# Patient Record
Sex: Female | Born: 1975 | Race: White | Hispanic: No | Marital: Married | Smoking: Current every day smoker
Health system: Southern US, Community
[De-identification: ages and names within clinical notes are randomized; demographics above are authoritative.]

## PROBLEM LIST (undated history)

## (undated) DIAGNOSIS — R51 Headache: Secondary | ICD-10-CM

## (undated) DIAGNOSIS — D329 Benign neoplasm of meninges, unspecified: Secondary | ICD-10-CM

## (undated) DIAGNOSIS — J449 Chronic obstructive pulmonary disease, unspecified: Secondary | ICD-10-CM

## (undated) DIAGNOSIS — M359 Systemic involvement of connective tissue, unspecified: Secondary | ICD-10-CM

## (undated) DIAGNOSIS — I10 Essential (primary) hypertension: Secondary | ICD-10-CM

## (undated) DIAGNOSIS — M329 Systemic lupus erythematosus, unspecified: Secondary | ICD-10-CM

## (undated) DIAGNOSIS — J45909 Unspecified asthma, uncomplicated: Secondary | ICD-10-CM

## (undated) DIAGNOSIS — IMO0002 Reserved for concepts with insufficient information to code with codable children: Secondary | ICD-10-CM

## (undated) DIAGNOSIS — I219 Acute myocardial infarction, unspecified: Secondary | ICD-10-CM

## (undated) DIAGNOSIS — I2510 Atherosclerotic heart disease of native coronary artery without angina pectoris: Secondary | ICD-10-CM

## (undated) DIAGNOSIS — F32A Depression, unspecified: Secondary | ICD-10-CM

## (undated) DIAGNOSIS — C801 Malignant (primary) neoplasm, unspecified: Secondary | ICD-10-CM

## (undated) DIAGNOSIS — R251 Tremor, unspecified: Secondary | ICD-10-CM

## (undated) DIAGNOSIS — M1990 Unspecified osteoarthritis, unspecified site: Secondary | ICD-10-CM

## (undated) DIAGNOSIS — R519 Headache, unspecified: Secondary | ICD-10-CM

## (undated) DIAGNOSIS — K589 Irritable bowel syndrome without diarrhea: Secondary | ICD-10-CM

## (undated) DIAGNOSIS — K219 Gastro-esophageal reflux disease without esophagitis: Secondary | ICD-10-CM

## (undated) DIAGNOSIS — N809 Endometriosis, unspecified: Secondary | ICD-10-CM

## (undated) DIAGNOSIS — K259 Gastric ulcer, unspecified as acute or chronic, without hemorrhage or perforation: Secondary | ICD-10-CM

## (undated) DIAGNOSIS — E119 Type 2 diabetes mellitus without complications: Secondary | ICD-10-CM

## (undated) DIAGNOSIS — E785 Hyperlipidemia, unspecified: Secondary | ICD-10-CM

## (undated) DIAGNOSIS — F419 Anxiety disorder, unspecified: Secondary | ICD-10-CM

## (undated) DIAGNOSIS — G4730 Sleep apnea, unspecified: Secondary | ICD-10-CM

## (undated) DIAGNOSIS — C4491 Basal cell carcinoma of skin, unspecified: Secondary | ICD-10-CM

## (undated) DIAGNOSIS — J189 Pneumonia, unspecified organism: Secondary | ICD-10-CM

## (undated) DIAGNOSIS — M797 Fibromyalgia: Secondary | ICD-10-CM

## (undated) HISTORY — DX: Basal cell carcinoma of skin, unspecified: C44.91

## (undated) HISTORY — PX: ABDOMINAL HYSTERECTOMY: SHX81

## (undated) HISTORY — PX: OTHER SURGICAL HISTORY: SHX169

## (undated) HISTORY — DX: Benign neoplasm of meninges, unspecified: D32.9

## (undated) HISTORY — DX: Type 2 diabetes mellitus without complications: E11.9

## (undated) HISTORY — PX: DILATION AND CURETTAGE OF UTERUS: SHX78

---

## 1996-09-13 HISTORY — PX: MULTIPLE TOOTH EXTRACTIONS: SHX2053

## 1997-05-25 DIAGNOSIS — M797 Fibromyalgia: Secondary | ICD-10-CM | POA: Insufficient documentation

## 2000-04-13 HISTORY — PX: DIAGNOSTIC LAPAROSCOPY: SUR761

## 2006-09-13 HISTORY — PX: CHOLECYSTECTOMY: SHX55

## 2008-09-13 HISTORY — PX: CARDIAC CATHETERIZATION: SHX172

## 2009-08-13 DIAGNOSIS — I219 Acute myocardial infarction, unspecified: Secondary | ICD-10-CM

## 2009-08-13 HISTORY — DX: Acute myocardial infarction, unspecified: I21.9

## 2011-09-14 HISTORY — PX: LAPAROSCOPIC OVARIAN CYSTECTOMY: SUR786

## 2017-03-13 DIAGNOSIS — J449 Chronic obstructive pulmonary disease, unspecified: Secondary | ICD-10-CM

## 2017-03-13 HISTORY — DX: Chronic obstructive pulmonary disease, unspecified: J44.9

## 2017-05-25 DIAGNOSIS — J449 Chronic obstructive pulmonary disease, unspecified: Secondary | ICD-10-CM | POA: Insufficient documentation

## 2017-06-16 DIAGNOSIS — M329 Systemic lupus erythematosus, unspecified: Secondary | ICD-10-CM | POA: Insufficient documentation

## 2017-06-16 DIAGNOSIS — IMO0002 Reserved for concepts with insufficient information to code with codable children: Secondary | ICD-10-CM | POA: Insufficient documentation

## 2017-06-27 DIAGNOSIS — Z6841 Body Mass Index (BMI) 40.0 and over, adult: Secondary | ICD-10-CM | POA: Insufficient documentation

## 2017-08-08 NOTE — H&P (Signed)
[name] is a 41 y.o. female  G2P1here for TAH / BSO and ureteral stenting  .Ptwith  CPP and worsening endometriosis . Pt has a loing h/o of pelvic pain and has 2 surgeries For CPP and was diagnosed with endometrioma/ endometrioisis(  ) . She has pain for upto 3 weeks / month usually worse before her cycle . She has tried Depo Provera , Depo Lupron   She is s/p MI 2010 and stent placement  and she has had a recent EST and echo  Reportedly WNL .    U/s today shows a 2 cm complex ovarian cyst , o/w test is nl .  Op report from [facility] 2013 showed  adhesions , right ovarian cyst and endometriosis. She underwent enterolysis , right ovarian cystectomy and fulguration of endometriosis .    She has a h/o Lupus and RA among other medical problems    S/p one cesarean  section   Past Medical History:  has a past medical history of Arthritis, COPD (chronic obstructive pulmonary disease) , unspecified (CMS-HCC), Endometriosis, Essential tremor, Fibromyalgia, GERD (gastroesophageal reflux disease), arthroscopy of knee (2016, 2017), Hyperlipidemia, unspecified, Hypertension, IBS (irritable bowel syndrome), Lupus, Miscarriage (04/1997), Myocardial infarction (CMS-HCC), Rheumatoid arthritis (CMS-HCC), Sleep apnea, and Stomach ulcer.  Past Surgical History:  has a past surgical history that includes Cesarean section; Cholecystectomy (2008); Dilation and curettage of uterus (1998); Laparoscopic ovarian cystectomy (09/2011); and Pelvic laparoscopy (04/2000 and 09/2011). Family History: family history includes Coronary Artery Disease (Blocked arteries around heart) in her father and mother; Fibroids in her sister; High blood pressure (Hypertension) in her sister; Irritable bowel syndrome in her mother. Social History:  reports that she has been smoking cigarettes.  She has been smoking about 1.00 pack per day. She has never used smokeless tobacco. She reports that she does not drink alcohol or use  drugs. OB/GYN History:          OB History    Gravida  2   Para  1   Term      Preterm      AB  1   Living  1     SAB      TAB      Ectopic      Molar      Multiple      Live Births  1          Allergies: is allergic to avelox [moxifloxacin]; cycline-250 [tetracycline]; macrobid [nitrofurantoin monohyd/m-cryst]; penicillin; and sulfa (sulfonamide antibiotics). Medications:  Current Outpatient Medications:  .  albuterol 90 mcg/actuation inhaler, Inhale 2 inhalations into the lungs every 6 (six) hours as needed for Wheezing., Disp: , Rfl:  .  aspirin 81 MG EC tablet, Take 81 mg by mouth once daily., Disp: , Rfl:  .  atorvastatin (LIPITOR) 80 MG tablet, Take 80 mg by mouth once daily., Disp: , Rfl:  .  azelastine (ASTELIN) 137 mcg nasal spray, Place 1 spray into both nostrils 2 (two) times daily as needed for Rhinitis., Disp: , Rfl:  .  baclofen (LIORESAL) 10 MG tablet, Take 1 tablet (10 mg total) by mouth 2 (two) times daily, Disp: 60 tablet, Rfl: 11 .  chlorthalidone 25 MG tablet, Take 1 tablet (25 mg total) by mouth once daily., Disp: 30 tablet, Rfl: 2 .  DULoxetine (CYMBALTA) 60 MG DR capsule, Take 1 capsule (60 mg total) by mouth 2 (two) times daily, Disp: 180 capsule, Rfl: 0 .  fluticasone (FLONASE) 50 mcg/actuation nasal spray,  Place 2 sprays into both nostrils once daily as needed for Rhinitis., Disp: , Rfl:  .  fluticasone-vilanterol (BREO ELLIPTA) 100-25 mcg/dose DsDv inhaler, Inhale 1 inhalation into the lungs once daily., Disp: , Rfl:  .  hydroxychloroquine (PLAQUENIL) 200 mg tablet, 1 TAB TWICE A DAY , 90 DAYS, Disp: 180 tablet, Rfl: 1 .  levocetirizine (XYZAL) 5 MG tablet, Take 1 tablet (5 mg total) by mouth every evening, Disp: 30 tablet, Rfl: 11 .  lisinopril (PRINIVIL,ZESTRIL) 20 MG tablet, Take 1 tablet (20 mg total) by mouth once daily, Disp: 30 tablet, Rfl: 5 .  metoprolol tartrate (LOPRESSOR) 50 MG tablet, Take 1 tablet (50 mg total) by mouth  2 (two) times daily, Disp: 60 tablet, Rfl: 5 .  oxyCODONE-acetaminophen (PERCOCET) 10-325 mg tablet, Take 1 tablet by mouth every 6 (six) hours as needed for Pain, Disp: 150 tablet, Rfl: 0 .  pantoprazole (PROTONIX) 40 MG DR tablet, Take 1 tablet (40 mg total) by mouth once daily, Disp: 30 tablet, Rfl: 6 .  primidone (MYSOLINE) 50 MG tablet, TAKE 1 TABLET BY MOUTH TWICE A DAY, Disp: 60 tablet, Rfl: 1 .  SUMAtriptan (IMITREX) 25 MG tablet, Take 25 mg by mouth once as needed for Migraine. May take a second dose after 2 hours if needed., Disp: , Rfl:  .  tiZANidine (ZANAFLEX) 4 MG tablet, Take 4 mg by mouth nightly., Disp: , Rfl:  .  traZODone (DESYREL) 100 MG tablet, Take by mouth nightly, Disp: , Rfl:  .  ibuprofen (ADVIL,MOTRIN) 800 MG tablet, Take 800 mg by mouth 2 (two) times daily as needed for Pain., Disp: , Rfl:   Review of Systems: General:                      No fatigue or weight loss Eyes:                           No vision changes Ears:                            No hearing difficulty Respiratory:                No cough or shortness of breath Pulmonary:                  No asthma or shortness of breath Cardiovascular:           No chest pain, palpitations, dyspnea on exertion Gastrointestinal:          No abdominal bloating, chronic diarrhea, constipations, masses, pain or hematochezia Genitourinary:             No hematuria, dysuria, abnormal vaginal discharge, ++pelvic pain, Menometrorrhagia Lymphatic:                   No swollen lymph nodes Musculoskeletal:         No muscle weakness Neurologic:                  No extremity weakness, syncope, seizure disorder Psychiatric:                  No history of depression, delusions or suicidal/homicidal ideation    Exam:      Vitals:   08/02/17 1456  BP: 126/78  Pulse: 82    Body mass index is 51.32 kg/m.  WDWN white/  female in  NAD   Lungs: CTA  CV : RRR without murmur   Neck:  no thyromegaly Abdomen: soft ,  no mass, normal active bowel sounds,  non-tender, no rebound tenderness Pelvic: tanner stage 5 ,  External genitalia: vulva /labia no lesions Urethra: no prolapse Vagina: normal physiologic d/c Cervix: no lesions, no cervical motion tenderness   Uterus: normal size shape and contour, non-tender Adnexa: no mass,  non-tender   Rectovaginal:   Impression:   The primary encounter diagnosis was Pelvic pain in female. Diagnoses of Endometriosis and Female dyspareunia were also pertinent to this visit.  Pain is chronic and conservative treatments with 2 surgeries and medications have not controlled her pain   Plan:   I have spoken with the patient regarding treatment options including expectant management, hormonal options, or surgical intervention. After a full discussion the pt elects to proceed with TAH/ BSO and ureteral stenting to aid with dissection . She is aware of her risk of early menopause  And esrly osteoporosis. She will not be a candidate for estrogen containing meds post op .   25 minutes with direct pt counseling     Maria Her Brentton Wardlow MD

## 2017-08-09 ENCOUNTER — Inpatient Hospital Stay: Admission: RE | Admit: 2017-08-09 | Payer: Medicare Other | Source: Ambulatory Visit

## 2017-08-11 ENCOUNTER — Encounter
Admission: RE | Admit: 2017-08-11 | Discharge: 2017-08-11 | Disposition: A | Discharge status: Home / Self Care | Payer: Medicare Other | Source: Ambulatory Visit | Attending: Obstetrics and Gynecology | Admitting: Obstetrics and Gynecology

## 2017-08-11 ENCOUNTER — Other Ambulatory Visit: Payer: Self-pay

## 2017-08-11 DIAGNOSIS — Z01818 Encounter for other preprocedural examination: Secondary | ICD-10-CM | POA: Insufficient documentation

## 2017-08-11 HISTORY — DX: Anxiety disorder, unspecified: F41.9

## 2017-08-11 HISTORY — DX: Acute myocardial infarction, unspecified: I21.9

## 2017-08-11 HISTORY — DX: Systemic lupus erythematosus, unspecified: M32.9

## 2017-08-11 HISTORY — DX: Endometriosis, unspecified: N80.9

## 2017-08-11 HISTORY — DX: Chronic obstructive pulmonary disease, unspecified: J44.9

## 2017-08-11 HISTORY — DX: Tremor, unspecified: R25.1

## 2017-08-11 HISTORY — DX: Fibromyalgia: M79.7

## 2017-08-11 HISTORY — DX: Sleep apnea, unspecified: G47.30

## 2017-08-11 HISTORY — DX: Headache: R51

## 2017-08-11 HISTORY — DX: Atherosclerotic heart disease of native coronary artery without angina pectoris: I25.10

## 2017-08-11 HISTORY — DX: Reserved for concepts with insufficient information to code with codable children: IMO0002

## 2017-08-11 HISTORY — DX: Essential (primary) hypertension: I10

## 2017-08-11 HISTORY — DX: Headache, unspecified: R51.9

## 2017-08-11 HISTORY — DX: Unspecified osteoarthritis, unspecified site: M19.90

## 2017-08-11 LAB — TYPE AND SCREEN
ABO/RH(D): O POS
Antibody Screen: NEGATIVE

## 2017-08-11 LAB — CBC
HCT: 40.5 % (ref 35.0–47.0)
Hemoglobin: 13.7 g/dL (ref 12.0–16.0)
MCH: 31.2 pg (ref 26.0–34.0)
MCHC: 33.9 g/dL (ref 32.0–36.0)
MCV: 92.1 fL (ref 80.0–100.0)
Platelets: 271 10*3/uL (ref 150–440)
RBC: 4.4 MIL/uL (ref 3.80–5.20)
RDW: 12.7 % (ref 11.5–14.5)
WBC: 12.1 10*3/uL — ABNORMAL HIGH (ref 3.6–11.0)

## 2017-08-11 LAB — BASIC METABOLIC PANEL
Anion gap: 8 (ref 5–15)
BUN: 10 mg/dL (ref 6–20)
CO2: 27 mmol/L (ref 22–32)
Calcium: 9 mg/dL (ref 8.9–10.3)
Chloride: 100 mmol/L — ABNORMAL LOW (ref 101–111)
Creatinine, Ser: 0.68 mg/dL (ref 0.44–1.00)
GFR calc Af Amer: >60 mL/min (ref >60)
GFR calc non Af Amer: >60 mL/min (ref >60)
Glucose, Bld: 141 mg/dL — ABNORMAL HIGH (ref 65–99)
Potassium: 3.6 mmol/L (ref 3.5–5.1)
Sodium: 135 mmol/L (ref 135–145)

## 2017-08-11 NOTE — Pre-Procedure Instructions (Signed)
Incentive Spirometer given to pt with verbal and written instructions, pt returned a correct demo.

## 2017-08-11 NOTE — Patient Instructions (Signed)
  Your procedure is scheduled on: Monday Dec.3, 2018. Report to Same Day Surgery. To find out your arrival time please call [phone] between 1PM - 3PM on .  Remember: Instructions that are not followed completely may result in serious medical risk, up to and including death, or upon the discretion of your surgeon and anesthesiologist your surgery may need to be rescheduled.    _x___ 1. Do not eat food after midnight night prior to surgery. No gum   chewing or hard candies, snacks or breakfast.    2023-02-23 drink the following: water, Gatorade, clear apple juice, black coffee     or black tea up until 2 hours prior to ARRIVAL time.     ____ 2. No Alcohol for 24 hours before or after surgery.   ____ 3. Bring all medications with you on the day of surgery if instructed.    __x__ 4. Notify your doctor if there is any change in your medical condition     (cold, fever, infections).    __x___ 5.   Do Not Smoke or use e-cigarettes For 24 Hours Prior to Your   Surgery.  Do not use any chewable tobacco products for at least 6   hours prior to  surgery.                      Do not wear jewelry, make-up, hairpins, clips or nail polish.  Do not wear lotions, powders, or perfumes.   Do not shave 48 hours prior to surgery. Men may shave face and neck.  Do not bring valuables to the hospital.    [facility] is not responsible for any belongings or valuables.               Contacts, dentures or bridgework may not be worn into surgery.  Leave your suitcase in the car. After surgery it may be brought to your room.  For patients admitted to the hospital, discharge time is determined by your  treatment team.   Patients discharged the day of surgery will not be allowed to drive home.    Please read over the following fact sheets that you were given:   [facility] Preparing for Surgery  __x__ Take these medicines the morning of surgery with A SIP OF WATER:    1. metoprolol tartrate (LOPRESSOR)   2.  pantoprazole (PROTONIX) take at bedtime the night prior to surgery and the morning of surgery     ____ Fleet Enema (as directed)   __x__ Use CHG Soap as directed on instruction sheet  ____ Use inhalers on the day of surgery and bring to hospital day of surgery  ____ Stop metformin 2 days prior to surgery    ____ Take 1/2 of usual insulin dose the night before surgery and none on the morning of          surgery.   __x__ Ask Dr. Clayborn Bigness if and when to stop aspirin.  __x__ Stop Anti-inflammatories such as Advil, Aleve, Ibuprofen, Motrin, Naproxen,  Naprosyn, Goodies powders or aspirin products. OK to take Tylenol.   ____ Stop supplements until after surgery.    __x__ Bring Bi-Pap to the hospital.

## 2017-08-14 MED ORDER — CLINDAMYCIN PHOSPHATE 900 MG/50ML IV SOLN
900.0000 mg | INTRAVENOUS | Status: AC
  Administered 2017-08-15: 900 mg via INTRAVENOUS

## 2017-08-14 MED ORDER — GENTAMICIN SULFATE 40 MG/ML IJ SOLN
440.0000 mg | INTRAVENOUS | Status: AC
  Administered 2017-08-15: 440 mg via INTRAVENOUS
  Filled 2017-08-14: qty 11

## 2017-08-14 MED ORDER — GENTAMICIN SULFATE 40 MG/ML IJ SOLN
INTRAVENOUS | Status: DC
  Filled 2017-08-14: qty 11

## 2017-08-15 ENCOUNTER — Inpatient Hospital Stay
Admission: RE | Admit: 2017-08-15 | Discharge: 2017-08-18 | DRG: 742 | Disposition: A | Discharge status: Home / Self Care | Payer: Medicare Other | Source: Ambulatory Visit | Attending: Obstetrics and Gynecology | Admitting: Obstetrics and Gynecology

## 2017-08-15 ENCOUNTER — Inpatient Hospital Stay: Payer: Medicare Other | Admitting: Anesthesiology

## 2017-08-15 ENCOUNTER — Other Ambulatory Visit: Payer: Self-pay

## 2017-08-15 ENCOUNTER — Encounter
Admission: RE | Disposition: A | Discharge status: Home / Self Care | Payer: Self-pay | Source: Ambulatory Visit | Attending: Obstetrics and Gynecology

## 2017-08-15 ENCOUNTER — Inpatient Hospital Stay: Payer: Medicare Other

## 2017-08-15 DIAGNOSIS — E876 Hypokalemia: Secondary | ICD-10-CM | POA: Diagnosis present

## 2017-08-15 DIAGNOSIS — Z7982 Long term (current) use of aspirin: Secondary | ICD-10-CM

## 2017-08-15 DIAGNOSIS — Z880 Allergy status to penicillin: Secondary | ICD-10-CM

## 2017-08-15 DIAGNOSIS — K219 Gastro-esophageal reflux disease without esophagitis: Secondary | ICD-10-CM | POA: Diagnosis present

## 2017-08-15 DIAGNOSIS — N802 Endometriosis of fallopian tube: Principal | ICD-10-CM | POA: Diagnosis present

## 2017-08-15 DIAGNOSIS — G8929 Other chronic pain: Secondary | ICD-10-CM | POA: Diagnosis present

## 2017-08-15 DIAGNOSIS — N809 Endometriosis, unspecified: Secondary | ICD-10-CM

## 2017-08-15 DIAGNOSIS — N736 Female pelvic peritoneal adhesions (postinfective): Secondary | ICD-10-CM | POA: Diagnosis present

## 2017-08-15 DIAGNOSIS — F17210 Nicotine dependence, cigarettes, uncomplicated: Secondary | ICD-10-CM | POA: Diagnosis present

## 2017-08-15 DIAGNOSIS — Z7902 Long term (current) use of antithrombotics/antiplatelets: Secondary | ICD-10-CM | POA: Diagnosis not present

## 2017-08-15 DIAGNOSIS — Z9889 Other specified postprocedural states: Secondary | ICD-10-CM

## 2017-08-15 DIAGNOSIS — I252 Old myocardial infarction: Secondary | ICD-10-CM

## 2017-08-15 DIAGNOSIS — Z419 Encounter for procedure for purposes other than remedying health state, unspecified: Secondary | ICD-10-CM

## 2017-08-15 DIAGNOSIS — I10 Essential (primary) hypertension: Secondary | ICD-10-CM | POA: Diagnosis present

## 2017-08-15 DIAGNOSIS — Z6841 Body Mass Index (BMI) 40.0 and over, adult: Secondary | ICD-10-CM

## 2017-08-15 DIAGNOSIS — J449 Chronic obstructive pulmonary disease, unspecified: Secondary | ICD-10-CM | POA: Diagnosis present

## 2017-08-15 HISTORY — PX: UTERINE STENT PLACEMENT: SHX6655

## 2017-08-15 LAB — CBC
HCT: 37.9 % (ref 35.0–47.0)
Hemoglobin: 13 g/dL (ref 12.0–16.0)
MCH: 31.7 pg (ref 26.0–34.0)
MCHC: 34.2 g/dL (ref 32.0–36.0)
MCV: 92.5 fL (ref 80.0–100.0)
Platelets: 278 10*3/uL (ref 150–440)
RBC: 4.1 MIL/uL (ref 3.80–5.20)
RDW: 12.8 % (ref 11.5–14.5)
WBC: 24.2 10*3/uL — ABNORMAL HIGH (ref 3.6–11.0)

## 2017-08-15 LAB — POCT PREGNANCY, URINE: Preg Test, Ur: NEGATIVE

## 2017-08-15 LAB — CREATININE, SERUM
Creatinine, Ser: 0.63 mg/dL (ref 0.44–1.00)
GFR calc Af Amer: >60 mL/min
GFR calc non Af Amer: >60 mL/min

## 2017-08-15 LAB — ABO/RH: ABO/RH(D): O POS

## 2017-08-15 SURGERY — HYSTERECTOMY, ABDOMINAL, WITH SALPINGO-OOPHORECTOMY
Anesthesia: General | Laterality: Bilateral | Wound class: Clean Contaminated

## 2017-08-15 MED ORDER — FLUTICASONE FUROATE-VILANTEROL 100-25 MCG/INH IN AEPB
1.0000 | INHALATION_SPRAY | Freq: Every day | RESPIRATORY_TRACT | Status: DC
  Administered 2017-08-16 – 2017-08-18 (×3): 1 via RESPIRATORY_TRACT
  Filled 2017-08-15: qty 28

## 2017-08-15 MED ORDER — DIPHENHYDRAMINE HCL 12.5 MG/5ML PO ELIX
12.5000 mg | ORAL_SOLUTION | Freq: Four times a day (QID) | ORAL | Status: DC | PRN
  Filled 2017-08-15: qty 5

## 2017-08-15 MED ORDER — KETOROLAC TROMETHAMINE 30 MG/ML IJ SOLN
30.0000 mg | Freq: Three times a day (TID) | INTRAMUSCULAR | Status: DC | PRN
  Administered 2017-08-15 – 2017-08-17 (×5): 30 mg via INTRAVENOUS
  Filled 2017-08-15 (×5): qty 1

## 2017-08-15 MED ORDER — LACTATED RINGERS IV SOLN
INTRAVENOUS | Status: DC
  Administered 2017-08-15: 20:00:00 via INTRAVENOUS

## 2017-08-15 MED ORDER — MORPHINE SULFATE 2 MG/ML IV SOLN
INTRAVENOUS | Status: DC
  Administered 2017-08-15: 23:00:00 via INTRAVENOUS
  Administered 2017-08-15: 13 mL via INTRAVENOUS
  Administered 2017-08-16: 08:00:00 via INTRAVENOUS
  Administered 2017-08-16: 13.6 mL via INTRAVENOUS
  Filled 2017-08-15 (×5): qty 30

## 2017-08-15 MED ORDER — DULOXETINE HCL 60 MG PO CPEP
60.0000 mg | ORAL_CAPSULE | Freq: Two times a day (BID) | ORAL | Status: DC
  Administered 2017-08-16 – 2017-08-18 (×5): 60 mg via ORAL
  Filled 2017-08-15 (×6): qty 1

## 2017-08-15 MED ORDER — SODIUM CHLORIDE 0.9 % IJ SOLN
INTRAMUSCULAR | Status: AC
  Administered 2017-08-15: 20:00:00
  Filled 2017-08-15: qty 10

## 2017-08-15 MED ORDER — FENTANYL CITRATE (PF) 100 MCG/2ML IJ SOLN
25.0000 ug | INTRAMUSCULAR | Status: DC | PRN
  Administered 2017-08-15 (×4): 25 ug via INTRAVENOUS

## 2017-08-15 MED ORDER — NALOXONE HCL 0.4 MG/ML IJ SOLN
0.4000 mg | INTRAMUSCULAR | Status: DC | PRN
  Filled 2017-08-15: qty 1

## 2017-08-15 MED ORDER — SODIUM CHLORIDE 0.9% FLUSH: 9.0000 mL | INTRAVENOUS | Status: DC | PRN

## 2017-08-15 MED ORDER — CLINDAMYCIN PHOSPHATE 900 MG/50ML IV SOLN
INTRAVENOUS | Status: AC
  Filled 2017-08-15: qty 50

## 2017-08-15 MED ORDER — BUPIVACAINE HCL (PF) 0.5 % IJ SOLN
INTRAMUSCULAR | Status: AC
  Filled 2017-08-15: qty 30

## 2017-08-15 MED ORDER — HYDROMORPHONE HCL 1 MG/ML IJ SOLN
INTRAMUSCULAR | Status: AC
  Administered 2017-08-15: 0.5 mg via INTRAVENOUS
  Filled 2017-08-15: qty 1

## 2017-08-15 MED ORDER — FLEET ENEMA 7-19 GM/118ML RE ENEM: 1.0000 | ENEMA | Freq: Once | RECTAL | Status: DC

## 2017-08-15 MED ORDER — FENTANYL CITRATE (PF) 100 MCG/2ML IJ SOLN
INTRAMUSCULAR | Status: AC
  Filled 2017-08-15: qty 2

## 2017-08-15 MED ORDER — SUGAMMADEX SODIUM 200 MG/2ML IV SOLN
INTRAVENOUS | Status: DC | PRN
  Administered 2017-08-15: 200 mg via INTRAVENOUS

## 2017-08-15 MED ORDER — LACTATED RINGERS IV SOLN
INTRAVENOUS | Status: DC
  Administered 2017-08-15 – 2017-08-17 (×5): via INTRAVENOUS

## 2017-08-15 MED ORDER — HYDROMORPHONE HCL 1 MG/ML IJ SOLN
0.5000 mg | INTRAMUSCULAR | Status: AC | PRN
  Administered 2017-08-15 (×4): 0.5 mg via INTRAVENOUS

## 2017-08-15 MED ORDER — DIPHENHYDRAMINE HCL 50 MG/ML IJ SOLN: 12.5000 mg | Freq: Four times a day (QID) | INTRAMUSCULAR | Status: DC | PRN

## 2017-08-15 MED ORDER — ONDANSETRON HCL 4 MG PO TABS: 4.0000 mg | ORAL_TABLET | Freq: Four times a day (QID) | ORAL | Status: DC | PRN

## 2017-08-15 MED ORDER — SODIUM CHLORIDE 0.9 % IV SOLN
INTRAVENOUS | Status: DC | PRN
  Administered 2017-08-15: 60 mL

## 2017-08-15 MED ORDER — AZELASTINE HCL 0.1 % NA SOLN
1.0000 | Freq: Two times a day (BID) | NASAL | Status: DC | PRN
  Filled 2017-08-15: qty 30

## 2017-08-15 MED ORDER — PROPOFOL 10 MG/ML IV BOLUS
INTRAVENOUS | Status: DC | PRN
  Administered 2017-08-15: 160 mg via INTRAVENOUS

## 2017-08-15 MED ORDER — ENOXAPARIN SODIUM 40 MG/0.4ML ~~LOC~~ SOLN
40.0000 mg | Freq: Two times a day (BID) | SUBCUTANEOUS | Status: DC
  Administered 2017-08-16: 40 mg via SUBCUTANEOUS
  Filled 2017-08-15: qty 0.4

## 2017-08-15 MED ORDER — METOPROLOL TARTRATE 5 MG/5ML IV SOLN
INTRAVENOUS | Status: DC | PRN
  Administered 2017-08-15: 1 mg via INTRAVENOUS

## 2017-08-15 MED ORDER — FENTANYL CITRATE (PF) 100 MCG/2ML IJ SOLN
INTRAMUSCULAR | Status: AC
  Administered 2017-08-15: 25 ug via INTRAVENOUS
  Filled 2017-08-15: qty 2

## 2017-08-15 MED ORDER — NALOXONE HCL 0.4 MG/ML IJ SOLN: 0.4000 mg | INTRAMUSCULAR | Status: DC | PRN

## 2017-08-15 MED ORDER — SODIUM CHLORIDE 0.9 % IJ SOLN
INTRAMUSCULAR | Status: AC
  Filled 2017-08-15: qty 50

## 2017-08-15 MED ORDER — MORPHINE SULFATE 2 MG/ML IV SOLN: INTRAVENOUS | Status: DC

## 2017-08-15 MED ORDER — PANTOPRAZOLE SODIUM 40 MG IV SOLR
40.0000 mg | INTRAVENOUS | Status: DC
  Administered 2017-08-15 – 2017-08-17 (×3): 40 mg via INTRAVENOUS
  Filled 2017-08-15 (×3): qty 40

## 2017-08-15 MED ORDER — ROCURONIUM BROMIDE 100 MG/10ML IV SOLN
INTRAVENOUS | Status: DC | PRN
  Administered 2017-08-15: 30 mg via INTRAVENOUS
  Administered 2017-08-15: 10 mg via INTRAVENOUS
  Administered 2017-08-15 (×2): 20 mg via INTRAVENOUS
  Administered 2017-08-15: 10 mg via INTRAVENOUS
  Administered 2017-08-15: 50 mg via INTRAVENOUS

## 2017-08-15 MED ORDER — ONDANSETRON HCL 4 MG/2ML IJ SOLN: 4.0000 mg | Freq: Once | INTRAMUSCULAR | Status: DC | PRN

## 2017-08-15 MED ORDER — METOPROLOL TARTRATE 50 MG PO TABS
50.0000 mg | ORAL_TABLET | Freq: Two times a day (BID) | ORAL | Status: DC
  Administered 2017-08-16 – 2017-08-18 (×4): 50 mg via ORAL
  Filled 2017-08-15 (×6): qty 1

## 2017-08-15 MED ORDER — SIMETHICONE 80 MG PO CHEW: 80.0000 mg | CHEWABLE_TABLET | Freq: Four times a day (QID) | ORAL | Status: DC | PRN

## 2017-08-15 MED ORDER — MIDAZOLAM HCL 2 MG/2ML IJ SOLN
INTRAMUSCULAR | Status: AC
  Filled 2017-08-15: qty 2

## 2017-08-15 MED ORDER — ACETAMINOPHEN 10 MG/ML IV SOLN
INTRAVENOUS | Status: DC | PRN
  Administered 2017-08-15: 1000 mg via INTRAVENOUS

## 2017-08-15 MED ORDER — MORPHINE SULFATE 2 MG/ML IV SOLN
INTRAVENOUS | Status: DC
  Administered 2017-08-15: 2 mg via INTRAVENOUS
  Filled 2017-08-15 (×4): qty 25

## 2017-08-15 MED ORDER — FENTANYL CITRATE (PF) 100 MCG/2ML IJ SOLN
INTRAMUSCULAR | Status: DC | PRN
  Administered 2017-08-15 (×2): 100 ug via INTRAVENOUS

## 2017-08-15 MED ORDER — BUPIVACAINE LIPOSOME 1.3 % IJ SUSP
INTRAMUSCULAR | Status: AC
  Filled 2017-08-15: qty 20

## 2017-08-15 MED ORDER — LACTATED RINGERS IV SOLN
INTRAVENOUS | Status: DC
  Administered 2017-08-15 (×4): via INTRAVENOUS

## 2017-08-15 MED ORDER — KETOROLAC TROMETHAMINE 30 MG/ML IJ SOLN
INTRAMUSCULAR | Status: DC | PRN
  Administered 2017-08-15: 30 mg via INTRAVENOUS

## 2017-08-15 MED ORDER — SUCCINYLCHOLINE CHLORIDE 20 MG/ML IJ SOLN
INTRAMUSCULAR | Status: DC | PRN
  Administered 2017-08-15: 100 mg via INTRAVENOUS

## 2017-08-15 MED ORDER — ACETAMINOPHEN 10 MG/ML IV SOLN
INTRAVENOUS | Status: AC
  Filled 2017-08-15: qty 100

## 2017-08-15 MED ORDER — LIDOCAINE HCL (CARDIAC) 20 MG/ML IV SOLN
INTRAVENOUS | Status: DC | PRN
  Administered 2017-08-15: 50 mg via INTRAVENOUS

## 2017-08-15 MED ORDER — MIDAZOLAM HCL 2 MG/2ML IJ SOLN
INTRAMUSCULAR | Status: DC | PRN
  Administered 2017-08-15: 2 mg via INTRAVENOUS

## 2017-08-15 MED ORDER — FLUTICASONE PROPIONATE 50 MCG/ACT NA SUSP
2.0000 | Freq: Every morning | NASAL | Status: DC
  Administered 2017-08-16 – 2017-08-18 (×3): 2 via NASAL
  Filled 2017-08-15: qty 16

## 2017-08-15 MED ORDER — BUPIVACAINE HCL (PF) 0.5 % IJ SOLN
INTRAMUSCULAR | Status: DC | PRN
  Administered 2017-08-15: 30 mL

## 2017-08-15 MED ORDER — ONDANSETRON HCL 4 MG/2ML IJ SOLN
4.0000 mg | Freq: Four times a day (QID) | INTRAMUSCULAR | Status: DC | PRN
Start: 2017-08-15 — End: 2017-08-15

## 2017-08-15 MED ORDER — ONDANSETRON HCL 4 MG/2ML IJ SOLN
4.0000 mg | Freq: Four times a day (QID) | INTRAMUSCULAR | Status: DC | PRN
  Administered 2017-08-16 – 2017-08-17 (×2): 4 mg via INTRAVENOUS
  Filled 2017-08-15 (×2): qty 2

## 2017-08-15 SURGICAL SUPPLY — 56 items
BAG URINE DRAINAGE (UROLOGICAL SUPPLIES) ×2 IMPLANT
CANISTER SUCT 1200ML W/VALVE (MISCELLANEOUS) ×2 IMPLANT
CATH ROBINSON RED A/P 16FR (CATHETERS) ×2 IMPLANT
CATH URETL WHISTLE TIP 4FR (CATHETERS) ×4 IMPLANT
CHLORAPREP W/TINT 26ML (MISCELLANEOUS) ×2 IMPLANT
CNTNR SPEC 2.5X3XGRAD LEK (MISCELLANEOUS) ×1
CONT SPEC 4OZ STER OR WHT (MISCELLANEOUS) ×1
CONTAINER SPEC 2.5X3XGRAD LEK (MISCELLANEOUS) ×1 IMPLANT
COUNTER NEEDLE 20/40 LG (NEEDLE) ×2 IMPLANT
COVER LIGHT HANDLE STERIS (MISCELLANEOUS) ×4 IMPLANT
DRAPE LAP W/FLUID (DRAPES) ×2 IMPLANT
DRAPE UNDER BUTTOCK W/FLU (DRAPES) ×2 IMPLANT
DRSG TELFA 3X8 NADH (GAUZE/BANDAGES/DRESSINGS) ×2 IMPLANT
ELECT BLADE 6.5 EXT (BLADE) ×2 IMPLANT
ELECT CAUTERY BLADE 6.4 (BLADE) ×2 IMPLANT
ELECT REM PT RETURN 9FT ADLT (ELECTROSURGICAL) ×2
ELECTRODE REM PT RTRN 9FT ADLT (ELECTROSURGICAL) ×1 IMPLANT
GAUZE SPONGE 4X4 12PLY STRL (GAUZE/BANDAGES/DRESSINGS) ×2 IMPLANT
GLOVE BIO SURGEON STRL SZ7 (GLOVE) ×2 IMPLANT
GLOVE BIO SURGEON STRL SZ8 (GLOVE) ×2 IMPLANT
GLOVE BIOGEL PI IND STRL 6.5 (GLOVE) ×1 IMPLANT
GLOVE BIOGEL PI INDICATOR 6.5 (GLOVE) ×1
GLOVE INDICATOR 7.5 STRL GRN (GLOVE) ×2 IMPLANT
GOWN STRL REUS W/ TWL LRG LVL3 (GOWN DISPOSABLE) ×2 IMPLANT
GOWN STRL REUS W/ TWL XL LVL3 (GOWN DISPOSABLE) ×1 IMPLANT
GOWN STRL REUS W/TWL LRG LVL3 (GOWN DISPOSABLE) ×2
GOWN STRL REUS W/TWL XL LVL3 (GOWN DISPOSABLE) ×1
HANDLE YANKAUER SUCT BULB TIP (MISCELLANEOUS) ×2 IMPLANT
IV LACTATED RINGERS 1000ML (IV SOLUTION) ×2 IMPLANT
KIT RM TURNOVER CYSTO AR (KITS) ×2 IMPLANT
LABEL OR SOLS (LABEL) ×2 IMPLANT
NEEDLE HYPO 22GX1.5 SAFETY (NEEDLE) ×2 IMPLANT
NS IRRIG 500ML POUR BTL (IV SOLUTION) ×2 IMPLANT
PACK BASIN MAJOR ARMC (MISCELLANEOUS) ×2 IMPLANT
PAD PREP 24X41 OB/GYN DISP (PERSONAL CARE ITEMS) ×2 IMPLANT
RETAINER VISCERA MED (MISCELLANEOUS) IMPLANT
SET CYSTO W/LG BORE CLAMP LF (SET/KITS/TRAYS/PACK) ×4 IMPLANT
SOL PREP PVP 2OZ (MISCELLANEOUS) ×2
SOLUTION PREP PVP 2OZ (MISCELLANEOUS) ×1 IMPLANT
SPONGE LAP 18X18 5 PK (GAUZE/BANDAGES/DRESSINGS) ×4 IMPLANT
SPONGE XRAY 4X4 16PLY STRL (MISCELLANEOUS) ×2 IMPLANT
STAPLER INSORB 30 2030 C-SECTI (MISCELLANEOUS) ×2 IMPLANT
STAPLER SKIN PROX 35W (STAPLE) IMPLANT
SURGILUBE 2OZ TUBE FLIPTOP (MISCELLANEOUS) ×2 IMPLANT
SUT PDS AB 1 TP1 96 (SUTURE) IMPLANT
SUT VIC AB 0 CT1 27 (SUTURE) ×3
SUT VIC AB 0 CT1 27XCR 8 STRN (SUTURE) ×3 IMPLANT
SUT VIC AB 0 CT1 36 (SUTURE) ×4 IMPLANT
SUT VIC AB 2-0 SH 27 (SUTURE) ×4
SUT VIC AB 2-0 SH 27XBRD (SUTURE) ×4 IMPLANT
SUT VICRYL PLUS ABS 0 54 (SUTURE) ×2 IMPLANT
SYR 20CC LL (SYRINGE) ×4 IMPLANT
SYR BULB IRRIG 60ML STRL (SYRINGE) ×2 IMPLANT
TRAY FOLEY W/METER SILVER 16FR (SET/KITS/TRAYS/PACK) ×2 IMPLANT
TRAY PREP VAG/GEN (MISCELLANEOUS) ×2 IMPLANT
WATER STERILE IRR 1000ML POUR (IV SOLUTION) ×2 IMPLANT

## 2017-08-15 NOTE — Anesthesia Postprocedure Evaluation (Signed)
Anesthesia Post Note  Patient: [name]  Procedure(s) Performed: HYSTERECTOMY ABDOMINAL WITH SALPINGO-OOPHORECTOMY (Bilateral ) UTERINE STENT PLACEMENT (Bilateral )  Patient location during evaluation: PACU Anesthesia Type: General Level of consciousness: awake Pain management: pain level controlled Vital Signs Assessment: post-procedure vital signs reviewed and stable Respiratory status: spontaneous breathing Cardiovascular status: stable Anesthetic complications: no     Last Vitals:  Vitals:   08/15/17 1346 08/15/17 1351  BP:    Pulse: 84 80  Resp: (!) 22 (!) 21  Temp:    SpO2: 94% 95%    Last Pain:  Vitals:   08/15/17 1351  TempSrc:   PainSc: 10-Worst pain ever                 VAN STAVEREN,Ioannis Schuh

## 2017-08-15 NOTE — Op Note (Signed)
   Pre-operative Diagnosis: Endometriosis  Post-operative Diagnosis: Same  Procedure performed: Lysis of adhesions  Surgeon: Clayburn Pert   Assistants: Dr. Ouida Sills  Anesthesia: General endotracheal anesthesia  ASA Class: 2  Surgeon: Clayburn Pert, MD FACS  Anesthesia: Gen. with endotracheal tube   Procedure Details  This was an intraoperative consultation.  The primary surgical team from OB/GYN called secondary to the sigmoid colon having dense inflammatory adhesions to the posterior cervical cuff.  They were performing an abdominal hysterectomy.  Dr. Ouida Sills requested assistance in performing the adhesiolysis to allow for the oversewing of the cervical cuff.  Upon my entering the operating room the dense inflammatory adhesions were identified and it was impossible to palpate any distinct plane between the sigmoid colon and the clamped cervical cuff.  The area was cleared off with suction and a lap sponge and using sharp dissection with Metzenbaum scissors the plane was developed.  After approximately 30 minutes of sharp dissection there was adequate posterior length on the cuff to allow it to be oversewn.  I assisted with retraction of the sigmoid colon while the cuff was oversewn.  Once this was completed I broke scrub and exited the operative theater.  The remainder of the operative details will be performed by the primary OB/GYN team.  Findings: Dense adhesions between sigmoid colon and cervical cuff   Estimated Blood Loss: 20 mL          Clayburn Pert, MD, FACS

## 2017-08-15 NOTE — Progress Notes (Signed)
Anticoagulation monitoring(Lovenox):  41 yo female ordered Lovenox 40 mg Q24h  Filed Weights   08/15/17 0816  Weight: (!) 303 lb (137.4 kg)   BMI 51.98    Lab Results  Component Value Date   CREATININE 0.68 08/11/2017   Estimated Creatinine Clearance: 127 mL/min (by C-G formula based on SCr of 0.68 mg/dL). Hemoglobin & Hematocrit     Component Value Date/Time   HGB 13.7 08/11/2017 1419   HCT 40.5 08/11/2017 1419     Per Protocol for Patient with estCrcl > 30 ml/min and BMI > 40, will transition to Lovenox 40 mg Q12h.

## 2017-08-15 NOTE — Anesthesia Preprocedure Evaluation (Signed)
Anesthesia Evaluation  Patient identified by MRN, date of birth, ID band Patient awake    Reviewed: Allergy & Precautions, NPO status , Patient's Chart, lab work & pertinent test results  Airway Mallampati: III       Dental  (+) Teeth Intact   Pulmonary sleep apnea , COPD,  COPD inhaler, Current Smoker,     + decreased breath sounds      Cardiovascular Exercise Tolerance: Good hypertension, Pt. on medications and Pt. on home beta blockers + CAD, + Past MI and + Cardiac Stents   Rhythm:Regular Rate:Normal     Neuro/Psych  Headaches, Anxiety    GI/Hepatic negative GI ROS, Neg liver ROS,   Endo/Other  Morbid obesity  Renal/GU negative Renal ROS     Musculoskeletal   Abdominal (+) + obese,   Peds negative pediatric ROS (+)  Hematology   Anesthesia Other Findings   Reproductive/Obstetrics                             Anesthesia Physical Anesthesia Plan  ASA: III  Anesthesia Plan: General   Post-op Pain Management:    Induction: Intravenous  PONV Risk Score and Plan: 0  Airway Management Planned: Oral ETT  Additional Equipment:   Intra-op Plan:   Post-operative Plan: Extubation in OR  Informed Consent: I have reviewed the patients History and Physical, chart, labs and discussed the procedure including the risks, benefits and alternatives for the proposed anesthesia with the patient or authorized representative who has indicated his/her understanding and acceptance.     Plan Discussed with: CRNA  Anesthesia Plan Comments:         Anesthesia Quick Evaluation

## 2017-08-15 NOTE — Progress Notes (Signed)
Day of Surgery Procedure(s) (LRB): HYSTERECTOMY ABDOMINAL WITH SALPINGO-OOPHORECTOMY (Bilateral) UTERINE STENT PLACEMENT unilateral  Subjective: Patient reports incisional pain.   On Morphine PCA Objective: I have reviewed patient's vital signs. Urine output 30cc/ hr x 3 , concentrated Vaginal Bleeding: minimal  Assessment: s/p Procedure(s): HYSTERECTOMY ABDOMINAL WITH SALPINGO-OOPHORECTOMY (Bilateral) UTERINE STENT PLACEMENT (Bilateral): stable Marginal urine output  Plan: ivf bolus 500 ccadd toradol  Start prophylactic lovenox in am   LOS: 0 days    [name] 08/15/2017, 7:54 PM

## 2017-08-15 NOTE — Anesthesia Procedure Notes (Signed)
Procedure Name: Intubation Date/Time: 08/15/2017 9:22 AM Performed by: Lesle Reek, CRNA Pre-anesthesia Checklist: Patient identified, Emergency Drugs available, Suction available, Patient being monitored and Timeout performed Patient Re-evaluated:Patient Re-evaluated prior to induction Oxygen Delivery Method: Circle system utilized Induction Type: IV induction Laryngoscope Size: Mac and 4 Grade View: Grade II Tube type: Oral Tube size: 7.5 mm Number of attempts: 1 Airway Equipment and Method: Stylet Placement Confirmation: ETT inserted through vocal cords under direct vision,  breath sounds checked- equal and bilateral,  CO2 detector and positive ETCO2 Secured at: 21 cm Tube secured with: Tape

## 2017-08-15 NOTE — Progress Notes (Signed)
Pt ready for TAH/ BSO . Labs reviewed NPO  All questions answered

## 2017-08-15 NOTE — Brief Op Note (Signed)
08/15/2017  1:09 PM  PATIENT:  [name]  41 y.o. female  PRE-OPERATIVE DIAGNOSIS:  Chronic Pelvic Pain  Endometriosis Dense sigmoid adhesion  POST-OPERATIVE DIAGNOSIS:  Chronic Pelvic Pain  Endometriosis Dense sigmoid pelvic adhesion  PROCEDURE:  TAH  / BSO  Right ureteral stenting. Lysis of adhesions  SURGEON:  Surgeon(s) and Role:    * Chaddrick Brue, Gwen Her, MD - Primary  Benjaman Kindler ,MD -assisting    * Clayburn Pert, MD - gen surgery consultant  PHYSICIAN ASSISTANT:   ASSISTANTS: none   ANESTHESIA:   general  EBL:  200 mL , IOF 2200 Urine output 200 cc  BLOOD ADMINISTERED:none  DRAINS: Urinary Catheter (Foley)   LOCAL MEDICATIONS USED:  MARCAINE    and BUPIVICAINE   SPECIMEN:  Source of Specimen:  bilateral tube and ovaries . cervix and uterus   DISPOSITION OF SPECIMEN:  PATHOLOGY  COUNTS:  YES  TOURNIQUET:  * No tourniquets in log *  DICTATION: .Other Dictation: Dictation Number verbal  PLAN OF CARE: Admit to inpatient   PATIENT DISPOSITION:  PACU - hemodynamically stable.   Delay start of Pharmacological VTE agent (>24hrs) due to surgical blood loss or risk of bleeding: not applicable

## 2017-08-15 NOTE — Transfer of Care (Signed)
Immediate Anesthesia Transfer of Care Note  Patient: [name]  Procedure(s) Performed: HYSTERECTOMY ABDOMINAL WITH SALPINGO-OOPHORECTOMY (Bilateral ) UTERINE STENT PLACEMENT (Bilateral )  Patient Location: PACU  Anesthesia Type:General  Level of Consciousness: awake, alert  and oriented  Airway & Oxygen Therapy: Patient Spontanous Breathing and Patient connected to face mask oxygen  Post-op Assessment: Report given to RN  Post vital signs: Reviewed and stable  Last Vitals:  Vitals:   08/15/17 0816  BP: 130/83  Pulse: 79  Resp: 18  Temp: 37 C  SpO2: 98%    Last Pain:  Vitals:   08/15/17 0816  TempSrc: Oral  PainSc: 10-Worst pain ever      Patients Stated Pain Goal: 2 (88/64/84 7207)  Complications: No apparent anesthesia complications

## 2017-08-15 NOTE — Anesthesia Post-op Follow-up Note (Signed)
Anesthesia QCDR form completed.        

## 2017-08-16 ENCOUNTER — Encounter: Payer: Self-pay | Admitting: Obstetrics and Gynecology

## 2017-08-16 LAB — CBC
HCT: 33 % — ABNORMAL LOW (ref 35.0–47.0)
Hemoglobin: 11 g/dL — ABNORMAL LOW (ref 12.0–16.0)
MCH: 30.8 pg (ref 26.0–34.0)
MCHC: 33.3 g/dL (ref 32.0–36.0)
MCV: 92.5 fL (ref 80.0–100.0)
Platelets: 231 10*3/uL (ref 150–440)
RBC: 3.56 MIL/uL — ABNORMAL LOW (ref 3.80–5.20)
RDW: 12.6 % (ref 11.5–14.5)
WBC: 11.5 10*3/uL — ABNORMAL HIGH (ref 3.6–11.0)

## 2017-08-16 LAB — BASIC METABOLIC PANEL
Anion gap: 6 (ref 5–15)
BUN: 10 mg/dL (ref 6–20)
CO2: 27 mmol/L (ref 22–32)
Calcium: 7.9 mg/dL — ABNORMAL LOW (ref 8.9–10.3)
Chloride: 102 mmol/L (ref 101–111)
Creatinine, Ser: 0.67 mg/dL (ref 0.44–1.00)
GFR calc Af Amer: >60 mL/min (ref >60)
GFR calc non Af Amer: >60 mL/min (ref >60)
Glucose, Bld: 111 mg/dL — ABNORMAL HIGH (ref 65–99)
Potassium: 3.1 mmol/L — ABNORMAL LOW (ref 3.5–5.1)
Sodium: 135 mmol/L (ref 135–145)

## 2017-08-16 MED ORDER — DIPHENHYDRAMINE HCL 50 MG/ML IJ SOLN: 12.5000 mg | Freq: Four times a day (QID) | INTRAMUSCULAR | Status: DC | PRN

## 2017-08-16 MED ORDER — NALOXONE HCL 0.4 MG/ML IJ SOLN: 0.4000 mg | INTRAMUSCULAR | Status: DC | PRN

## 2017-08-16 MED ORDER — SODIUM CHLORIDE 0.9% FLUSH: 9.0000 mL | INTRAVENOUS | Status: DC | PRN

## 2017-08-16 MED ORDER — ONDANSETRON HCL 4 MG/2ML IJ SOLN: 4.0000 mg | Freq: Four times a day (QID) | INTRAMUSCULAR | Status: DC | PRN

## 2017-08-16 MED ORDER — DIPHENHYDRAMINE HCL 12.5 MG/5ML PO ELIX
12.5000 mg | ORAL_SOLUTION | Freq: Four times a day (QID) | ORAL | Status: DC | PRN
  Filled 2017-08-16: qty 5

## 2017-08-16 MED ORDER — LIDOCAINE 5 % EX PTCH
1.0000 | MEDICATED_PATCH | CUTANEOUS | Status: DC
  Administered 2017-08-16 – 2017-08-18 (×3): 1 via TRANSDERMAL
  Filled 2017-08-16 (×3): qty 1

## 2017-08-16 MED ORDER — ACETAMINOPHEN 500 MG PO TABS
1000.0000 mg | ORAL_TABLET | Freq: Four times a day (QID) | ORAL | Status: DC | PRN
  Administered 2017-08-16: 1000 mg via ORAL
  Filled 2017-08-16: qty 2

## 2017-08-16 MED ORDER — HYDROMORPHONE 1 MG/ML IV SOLN
INTRAVENOUS | Status: DC
  Administered 2017-08-16: 25 mg via INTRAVENOUS
  Administered 2017-08-17: 0.749 mg via INTRAVENOUS
  Administered 2017-08-17: 2.64 mg via INTRAVENOUS
  Filled 2017-08-16: qty 25

## 2017-08-16 MED ORDER — ALBUTEROL SULFATE (2.5 MG/3ML) 0.083% IN NEBU
2.5000 mg | INHALATION_SOLUTION | Freq: Four times a day (QID) | RESPIRATORY_TRACT | Status: DC
  Administered 2017-08-16 – 2017-08-18 (×6): 2.5 mg via RESPIRATORY_TRACT
  Filled 2017-08-16 (×7): qty 3

## 2017-08-16 MED ORDER — POTASSIUM CHLORIDE CRYS ER 20 MEQ PO TBCR
20.0000 meq | EXTENDED_RELEASE_TABLET | Freq: Two times a day (BID) | ORAL | Status: AC
  Administered 2017-08-16 (×2): 20 meq via ORAL
  Filled 2017-08-16 (×2): qty 1

## 2017-08-16 MED ORDER — ENOXAPARIN SODIUM 40 MG/0.4ML ~~LOC~~ SOLN
40.0000 mg | SUBCUTANEOUS | Status: DC
  Administered 2017-08-17 – 2017-08-18 (×2): 40 mg via SUBCUTANEOUS
  Filled 2017-08-16 (×2): qty 0.4

## 2017-08-16 NOTE — Op Note (Signed)
NAMEJASMENE, [name]              ACCOUNT NO.:  [id number]  MEDICAL RECORD NO.:  [id number]  LOCATION:                                 FACILITY:  PHYSICIAN:  Laverta Baltimore, MD     DATE OF BIRTH:  DATE OF PROCEDURE: DATE OF DISCHARGE:                              OPERATIVE REPORT   PREOPERATIVE DIAGNOSIS: 1. Chronic pelvic pain. 2. Presumed endometriosis.  POSTOPERATIVE DIAGNOSIS: 1. Chronic pelvic pain. 2. Pelvic adhesive disease.  PROCEDURE PERFORMED: 1. Total abdominal hysterectomy. 2. Bilateral salpingo-oophorectomy. 3. Lysis of adhesions. 4. Ureteral stenting, unilateral, right.  SURGEON:  Laverta Baltimore, MD  FIRST ASSISTANT:  Benjaman Kindler, MD.  INTRAOPERATIVE CONSULTANT:  Dr. Christen Butter, General Surgery.  INDICATIONS:  A 41 year old, gravida 2, para 1 patient with greater than 10-year history of chronic pelvic pain, status post 2 laparoscopies, documented endometriosis.  The patient continues to have pelvic pain despite conservative treatment.  DESCRIPTION OF PROCEDURE:  After adequate general endotracheal anesthesia, the patient was placed in dorsal supine position with the legs in the Benbow stirrups.  The patient's abdomen, perineum, and vagina were prepped and draped in a normal sterile fashion.  The patient did receive intravenous gentamicin and clindamycin for surgical prophylaxis. Time-out was performed.  Dr. Leafy Ro then performed cystoscopy with ureteral stenting on the right without difficulty.  Several attempts to cannulate the left ureter were unsuccessful with the ureteral stent, therefore that portion of the procedure was aborted.  Foley catheter was placed and the right ureteral stent was attached to the Foley.  Gloves were changed.  A Pfannenstiel incision was made 2 fingerbreadths above the symphysis pubis.  Sharp dissection was used to identify the fascia. Fascia was opened in the midline in a transverse fashion.  The superior aspect  of the fascia was grasped with Kocher clamps and the recti muscle was dissected free.  Inferior aspect of fascia was grasped with Kocher clamps and the pyramidalis muscle was dissected free.  Entry into the peritoneal cavity was accomplished sharply.  O'Connor-O'Sullivan retractor was placed into the abdomen and the bowel was packed cephalad. Two Kelly clamps were placed on the uterine cornua to be used for retraction.  Round ligaments on both sides were clamped, transected, and suture ligated with 0 Vicryl suture.  Anterior leaf of the broad ligament was incised along the bladder reflection to the midline from both sides.  The bladder was then gently dissected off the lower uterine segment sharply and with blunt dissection with a sponge stick.  Of note, the sigmoid colon was densely adherent to the posterior cervix where the uterosacral ligaments communicated with the cervix.  The infundibulopelvic pelvic ligaments on both sides were doubly clamped, transected, and suture ligated with 0 Vicryl suture.  Good hemostasis was noted.  Uterine arteries were skeletonized bilaterally, clamped with Heaney clamps and transected, suture ligated with 0 Vicryl suture and good hemostasis was noted.  Given the amount of dense adhesions of the sigmoid colon to the posterior side of the cervix, General Surgery was consulted intraoperatively and Dr. Christen Butter scrubbed in and aided in dissection of the bowel from the posterior cervix.  Please refer to his intraoperative report.  Cardinal ligaments  were clamped bilaterally, transected, suture ligated and ultimately the Heaney clamps were placed at the angles of the vagina and the cervix was removed with the uterus. The vaginal cuff was then closed with interrupted 0 Vicryl suture.  Good hemostasis noted.  The patient's abdomen was then copiously irrigated with no additional bleeding sites.  Pedicles appeared hemostatic.  All packing sponges were then removed.   Instruments were removed from the abdomen.  The fascia was then closed with a running 0 Vicryl suture. Additional 0 PDS was used to give additional reinforcement at the central portion of the fascia and given that it was on some slight tension.  The rest of the fascia was closed with 0 Vicryl suture.  The fascial line was then injected with a solution of 1.3% Exparel 20 mL added to 30 mL of 0.5% Marcaine and 50 mL of saline.  60 mL was injected in the fascial line.  Subcutaneous tissues were then irrigated and bovied for hemostasis.  Given the depth of the subcutaneous tissue approximately 5 cm, the dead space was closed with a running 2-0 chromic suture.  Skin was then reapproximated with Insorb absorbable staples with good cosmetic effect.  The right ureteral stent was then removed. The Foley was kept in place.  There were no complications.  ESTIMATED BLOOD LOSS:  200 mL general.  INTRAOPERATIVE FLUIDS:  2200 mL.  URINE OUTPUT:  1200 mL.  The patient tolerated the procedure well and was taken to the recovery room in good condition.    ______________________________ Laverta Baltimore, MD   ______________________________ Laverta Baltimore, MD    TS/MEDQ  D:  08/15/2017  T:  08/16/2017  Job:  [id number]

## 2017-08-16 NOTE — Progress Notes (Signed)
1 Day Post-Op Procedure(s) (LRB): HYSTERECTOMY ABDOMINAL WITH SALPINGO-OOPHORECTOMY (Bilateral) UTERINE STENT PLACEMENT (Bilateral)  Subjective: Patient reports still in significant incisional pain .   PCA + toradol  Objective: I have reviewed patient's vital signs, intake and output, medications and labs.  General: alert and cooperative Resp: wheezes RLL and RUL Cardio: regular rate and rhythm, S1, S2 normal, no murmur, click, rub or gallop GI: incision: dry, +hypoactive BS  Assessment: s/p Procedure(s): HYSTERECTOMY ABDOMINAL WITH SALPINGO-OOPHORECTOMY (Bilateral) UTERINE STENT PLACEMENT (Bilateral): stable Pain not under control  hypokalemia  RAD  Plan: will switch to dilaudid PCA , cont toradol , add lidocaine patch . Cont foley . add Kdur  Restart inhalers  And other po meds   clear liquid diet  lovenox prophylaxis given today   LOS: 1 day    [name] 08/16/2017, 10:00 AM

## 2017-08-17 LAB — BASIC METABOLIC PANEL
Anion gap: 6 (ref 5–15)
BUN: 6 mg/dL (ref 6–20)
CO2: 25 mmol/L (ref 22–32)
Calcium: 7.8 mg/dL — ABNORMAL LOW (ref 8.9–10.3)
Chloride: 104 mmol/L (ref 101–111)
Creatinine, Ser: 0.56 mg/dL (ref 0.44–1.00)
GFR calc Af Amer: >60 mL/min (ref >60)
GFR calc non Af Amer: >60 mL/min (ref >60)
Glucose, Bld: 135 mg/dL — ABNORMAL HIGH (ref 65–99)
Potassium: 3.4 mmol/L — ABNORMAL LOW (ref 3.5–5.1)
Sodium: 135 mmol/L (ref 135–145)

## 2017-08-17 LAB — CBC
HCT: 29.9 % — ABNORMAL LOW (ref 35.0–47.0)
Hemoglobin: 10.3 g/dL — ABNORMAL LOW (ref 12.0–16.0)
MCH: 31.6 pg (ref 26.0–34.0)
MCHC: 34.3 g/dL (ref 32.0–36.0)
MCV: 91.9 fL (ref 80.0–100.0)
Platelets: 235 10*3/uL (ref 150–440)
RBC: 3.25 MIL/uL — ABNORMAL LOW (ref 3.80–5.20)
RDW: 12.5 % (ref 11.5–14.5)
WBC: 12.5 10*3/uL — ABNORMAL HIGH (ref 3.6–11.0)

## 2017-08-17 LAB — GLUCOSE, CAPILLARY: Glucose-Capillary: 138 mg/dL — ABNORMAL HIGH (ref 65–99)

## 2017-08-17 LAB — SURGICAL PATHOLOGY

## 2017-08-17 MED ORDER — IBUPROFEN 800 MG PO TABS
800.0000 mg | ORAL_TABLET | Freq: Three times a day (TID) | ORAL | Status: DC | PRN
  Administered 2017-08-17 – 2017-08-18 (×3): 800 mg via ORAL
  Filled 2017-08-17 (×3): qty 1

## 2017-08-17 MED ORDER — DOCUSATE SODIUM 100 MG PO CAPS
100.0000 mg | ORAL_CAPSULE | Freq: Two times a day (BID) | ORAL | Status: DC
  Administered 2017-08-17 – 2017-08-18 (×3): 100 mg via ORAL
  Filled 2017-08-17 (×3): qty 1

## 2017-08-17 MED ORDER — OXYCODONE-ACETAMINOPHEN 7.5-325 MG PO TABS
2.0000 | ORAL_TABLET | ORAL | Status: DC | PRN
  Administered 2017-08-17 – 2017-08-18 (×6): 2 via ORAL
  Filled 2017-08-17 (×6): qty 2

## 2017-08-17 MED ORDER — PROMETHAZINE HCL 25 MG/ML IJ SOLN: 12.5000 mg | Freq: Four times a day (QID) | INTRAMUSCULAR | Status: DC | PRN

## 2017-08-17 MED ORDER — SUMATRIPTAN SUCCINATE 50 MG PO TABS
25.0000 mg | ORAL_TABLET | Freq: Once | ORAL | Status: AC
  Administered 2017-08-17: 25 mg via ORAL
  Filled 2017-08-17: qty 1

## 2017-08-17 MED ORDER — NICOTINE 14 MG/24HR TD PT24
14.0000 mg | MEDICATED_PATCH | Freq: Every day | TRANSDERMAL | Status: DC
  Administered 2017-08-17 – 2017-08-18 (×2): 14 mg via TRANSDERMAL
  Filled 2017-08-17 (×2): qty 1

## 2017-08-17 NOTE — Progress Notes (Signed)
2 Days Post-Op Procedure(s) (LRB): HYSTERECTOMY ABDOMINAL WITH SALPINGO-OOPHORECTOMY (Bilateral) UTERINE STENT PLACEMENT (Bilateral)  Subjective: Patient reports pain is improving  Still on PCA  + clear liquids and + flatus  + H/a  No sob  Still on lovenox daily .    Objective: I have reviewed patient's vital signs and medications.  General: alert and cooperative Resp: clear to auscultation bilaterally Cardio: regular rate and rhythm, S1, S2 normal, no murmur, click, rub or gallop GI: soft, non-tender; bowel sounds normal; no masses,  no organomegaly Incision C/D/I , small ecchymosis Assessment: s/p Procedure(s): HYSTERECTOMY ABDOMINAL WITH SALPINGO-OOPHORECTOMY (Bilateral) UTERINE STENT PLACEMENT (Bilateral): stable  Plan: Advance diet  D/c PCA , start po pain meds ,  Saline lock Iv ,  Ambulation . Pt may shower   LOS: 2 days    Boykin Nearing 08/17/2017, 9:37 AM

## 2017-08-17 NOTE — Care Management Important Message (Signed)
Important Message  Patient Details  Name: [name] MRN: [id number] Date of Birth: 30-Dec-1975   Medicare Important Message Given:  Yes    Shelbie Ammons, RN 08/17/2017, 7:20 AM

## 2017-08-18 ENCOUNTER — Ambulatory Visit: Payer: Medicare Other | Admitting: Student in an Organized Health Care Education/Training Program

## 2017-08-18 MED ORDER — IBUPROFEN 800 MG PO TABS: 800.0000 mg | ORAL_TABLET | Freq: Three times a day (TID) | ORAL | 0 refills | Status: AC | PRN

## 2017-08-18 MED ORDER — OXYCODONE-ACETAMINOPHEN 7.5-325 MG PO TABS: 2.0000 | ORAL_TABLET | Freq: Four times a day (QID) | ORAL | 0 refills | Status: DC | PRN

## 2017-08-18 MED ORDER — ENOXAPARIN SODIUM 40 MG/0.4ML ~~LOC~~ SOLN: 40.0000 mg | SUBCUTANEOUS | 0 refills | Status: DC

## 2017-08-18 MED ORDER — SODIUM CHLORIDE 0.9% FLUSH
3.0000 mL | Freq: Three times a day (TID) | INTRAVENOUS | Status: DC
  Administered 2017-08-18: 3 mL via INTRAVENOUS

## 2017-08-18 MED ORDER — LIDOCAINE 5 % EX PTCH: 1.0000 | MEDICATED_PATCH | Freq: Two times a day (BID) | CUTANEOUS | 0 refills | Status: AC

## 2017-08-18 MED ORDER — PROMETHAZINE HCL 12.5 MG PO TABS: 12.5000 mg | ORAL_TABLET | Freq: Four times a day (QID) | ORAL | 0 refills | Status: DC | PRN

## 2017-08-18 MED ORDER — CLONIDINE HCL 0.1 MG/24HR TD PTWK: 0.1000 mg | MEDICATED_PATCH | TRANSDERMAL | 11 refills | Status: DC

## 2017-08-18 MED ORDER — DOCUSATE SODIUM 100 MG PO CAPS: 100.0000 mg | ORAL_CAPSULE | Freq: Every day | ORAL | 2 refills | Status: DC | PRN

## 2017-08-18 MED ORDER — SUMATRIPTAN SUCCINATE 25 MG PO TABS
25.0000 mg | ORAL_TABLET | Freq: Once | ORAL | Status: AC
  Administered 2017-08-18: 25 mg via ORAL
  Filled 2017-08-18: qty 1

## 2017-08-18 NOTE — Progress Notes (Signed)
Patient discharged, concerns addressed. Discharge summary given to patient. Prescriptions provided. IV site removed

## 2017-08-18 NOTE — Discharge Summary (Signed)
Physician Discharge Summary  Patient ID: [name] MRN: [id number] DOB/AGE: 1976/07/15 41 y.o.  Admit date: 08/15/2017 Discharge date: 08/18/2017  Admission Diagnoses:cpp  Discharge Diagnoses: same  Active Problems:   Postoperative state   Discharged Condition: good  Hospital Course: pt underwent an uncomplicated TAH / BSO and adhesiolysis for significant CPP . Prior documentation of endometriosis. Post op required 2 days of PCA and was start on prophylactic lovenox for her risk of VTE . + hot flashes on POD#3 / day of d/c .    Consults:gen surgery intraop to aid in sigmoid resection from posterior cx  Significant Diagnostic Studies: labs:  Results for orders placed or performed during the hospital encounter of 08/15/17 (from the past 72 hour(s))  Surgical pathology     Status: None   Collection Time: 08/15/17 11:45 AM  Result Value Ref Range   SURGICAL PATHOLOGY      Surgical Pathology CASE: [id number] PATIENT: [name] Surgical Pathology Report     SPECIMEN SUBMITTED: A. Uterus, cervix, bilateral tubes and ovaries B. Uterine nodule, anterior  CLINICAL HISTORY: None provided  PRE-OPERATIVE DIAGNOSIS: Chronic pelvic pain endometriosis  POST-OPERATIVE DIAGNOSIS: same as pre op     DIAGNOSIS: A. UTERUS WITH CERVIX, BILATERAL FALLOPIAN TUBE AND OVARIES; TOTAL HYSTERECTOMY WITH BILATERAL SALPINGO OOPHORECTOMY: - CHRONIC CERVICITIS. - PROLIFERATIVE ENDOMETRIUM WITH TUBAL METAPLASIA. - BENIGN SEROSAL CYST. - UNREMARKABLE MYOMETRIUM. - BILATERAL OVARIES WITH FOLLICULAR CYSTS. - FALLOPIAN TUBES WITH ENDOMETRIOSIS. - NEGATIVE FOR ATYPIA AND MALIGNANCY.  B. UTERINE NODULE, ANTERIOR; EXCISION: - CONSISTENT WITH ORGANIZED FAT NECROSIS. - NEGATIVE FOR ATYPIA AND MALIGNANCY.   GROSS DESCRIPTION:  A. The specimen is received in a formalin-filled container labeled with the patient's name and uterus, cervix, bilate ral tubes and ovaries.  Adnexa:  bilateral unattached Weight: uterus in 2 fragments, 90 grams Dimensions:      Fundus -5.5 x 5.0 x 3.7 cm      Cervix -2.5 x 2.1 cm Serosa: purple tan with focal ecchymosis and a 0.5 cm cyst Cervix: smooth pink-tan Endocervix: trabecular pink-tan Endometrial cavity:      Dimensions -3.2 x 2.1 cm      Thickness -to 0.1 cm      Other findings -none noted Myometrium:     Thickness -2.0 cm     Other findings -none noted Adnexa:      First ovary (marked blue)           Weight -17 grams           Measurement -3.5 x 2.5 x 2.2 cm           Serosa -heterogeneous purple tan           Cut surface -heterogeneous purple tan with a 2 cm blood clot filled cysts      First fallopian tube (attached to first ovary)           Measurements -1.2 x 1.0 x 1.0 cm           Other findings -purple tan essentially all fimbria      Second ovary            Weight -15 grams           Measurement -3.4 x 2.6 x 2.3 cm           Serosa -purple tan lobulated w ith ecchymosis           Cut surface -heterogeneous pink-tan with a 0.9 cm clear fluid-filled cysts and multiple small  cysts      Second fallopian tube (attached to second ovary)            Measurements -4.0 cm in length x 1.4 cm in diameter           Other findings -purple tan fimbriated Other comments: no additional noted  Block summary: 1 - 2- representative cervix 3 - representative 1 side endomyometrium 4 - representative opposite side endomyometrium and serosal cyst 5 -longitudinal entire fimbria first fallopian tube 6 -representative cross-sections and longitudinal fimbriated end second fallopian tube 7 -representative first ovary 8 -representative second ovary  B. Labeled: anterior uterine nodule  Tissue fragment(s): 1  Size: 1.7 x 1.1 x 0.2 cm  Description: yellow lobulated fibrofatty tissue with a slightly firm red at one end  Entirely submitted in one cassette(s).    Final Diagnosis performed by Quay Burow, MD.   Electronically signed 08/18/19 18 9:41:40AM    The electronic signature indicates that the named Attending Pathologist has evaluated the specimen  Technical component performed at [facility], [address], [place], [place] [address] Lab: [phone] Dir: Rush Farmer, MD, MMM  Professional component performed at [facility], [facility], [place], [place], [place] [address] Lab: [phone] Dir: Dellia Nims. Rubinas, MD    CBC     Status: Abnormal   Collection Time: 08/15/17  4:14 PM  Result Value Ref Range   WBC 24.2 (H) 3.6 - 11.0 K/uL   RBC 4.10 3.80 - 5.20 MIL/uL   Hemoglobin 13.0 12.0 - 16.0 g/dL   HCT 37.9 35.0 - 47.0 %   MCV 92.5 80.0 - 100.0 fL   MCH 31.7 26.0 - 34.0 pg   MCHC 34.2 32.0 - 36.0 g/dL   RDW 12.8 11.5 - 14.5 %   Platelets 278 150 - 440 K/uL  Creatinine, serum     Status: None   Collection Time: 08/15/17  4:14 PM  Result Value Ref Range   Creatinine, Ser 0.63 0.44 - 1.00 mg/dL   GFR calc non Af Amer >60 >60 mL/min   GFR calc Af Amer >60 >60 mL/min    Comment: (NOTE) The eGFR has been calculated using the CKD EPI equation. This calculation has not been validated in all clinical situations. eGFR's persistently <60 mL/min signify possible Chronic Kidney Disease.   CBC     Status: Abnormal   Collection Time: 08/16/17  4:29 AM  Result Value Ref Range   WBC 11.5 (H) 3.6 - 11.0 K/uL   RBC 3.56 (L) 3.80 - 5.20 MIL/uL   Hemoglobin 11.0 (L) 12.0 - 16.0 g/dL   HCT 33.0 (L) 35.0 - 47.0 %   MCV 92.5 80.0 - 100.0 fL   MCH 30.8 26.0 - 34.0 pg   MCHC 33.3 32.0 - 36.0 g/dL   RDW 12.6 11.5 - 14.5 %   Platelets 231 150 - 440 K/uL  Basic metabolic panel     Status: Abnormal   Collection Time: 08/16/17  4:29 AM  Result Value Ref Range   Sodium 135 135 - 145 mmol/L   Potassium 3.1 (L) 3.5 - 5.1 mmol/L   Chloride 102 101 - 111 mmol/L   CO2 27 22 - 32 mmol/L   Glucose, Bld 111 (H) 65 - 99 mg/dL   BUN 10 6 - 20 mg/dL   Creatinine, Ser 0.67  0.44 - 1.00 mg/dL   Calcium 7.9 (L) 8.9 - 10.3 mg/dL   GFR calc non Af Amer >60 >60 mL/min   GFR  calc Af Amer >60 >60 mL/min    Comment: (NOTE) The eGFR has been calculated using the CKD EPI equation. This calculation has not been validated in all clinical situations. eGFR's persistently <60 mL/min signify possible Chronic Kidney Disease.    Anion gap 6 5 - 15  Basic metabolic panel     Status: Abnormal   Collection Time: 08/17/17  4:55 AM  Result Value Ref Range   Sodium 135 135 - 145 mmol/L   Potassium 3.4 (L) 3.5 - 5.1 mmol/L   Chloride 104 101 - 111 mmol/L   CO2 25 22 - 32 mmol/L   Glucose, Bld 135 (H) 65 - 99 mg/dL   BUN 6 6 - 20 mg/dL   Creatinine, Ser 0.56 0.44 - 1.00 mg/dL   Calcium 7.8 (L) 8.9 - 10.3 mg/dL   GFR calc non Af Amer >60 >60 mL/min   GFR calc Af Amer >60 >60 mL/min    Comment: (NOTE) The eGFR has been calculated using the CKD EPI equation. This calculation has not been validated in all clinical situations. eGFR's persistently <60 mL/min signify possible Chronic Kidney Disease.    Anion gap 6 5 - 15  CBC     Status: Abnormal   Collection Time: 08/17/17  4:55 AM  Result Value Ref Range   WBC 12.5 (H) 3.6 - 11.0 K/uL   RBC 3.25 (L) 3.80 - 5.20 MIL/uL   Hemoglobin 10.3 (L) 12.0 - 16.0 g/dL   HCT 29.9 (L) 35.0 - 47.0 %   MCV 91.9 80.0 - 100.0 fL   MCH 31.6 26.0 - 34.0 pg   MCHC 34.3 32.0 - 36.0 g/dL   RDW 12.5 11.5 - 14.5 %   Platelets 235 150 - 440 K/uL  Glucose, capillary     Status: Abnormal   Collection Time: 08/17/17  3:16 PM  Result Value Ref Range   Glucose-Capillary 138 (H) 65 - 99 mg/dL    Treatments: surgery: as above   Discharge Exam: Blood pressure 120/68, pulse 83, temperature 98 F (36.7 C), temperature source Oral, resp. rate (!) 22, height 5' 4"  (1.626 m), weight (!) 137.4 kg (303 lb), last menstrual period 07/20/2017, SpO2 97 %. General appearance: alert and cooperative Cardio: regular rate and rhythm, S1, S2 normal, no murmur,  click, rub or gallop GI: soft, non-tender; bowel sounds normal; no masses,  no organomegaly Incision/Wound:C/D/I , some ecchymosis  Disposition: Final discharge disposition not confirmed   Allergies as of 08/18/2017      Reactions   Avelox [moxifloxacin Hcl In Nacl] Nausea And Vomiting   Levaquin [levofloxacin In D5w]    Makes me achey all over   Penicillins Hives   Has patient had a PCN reaction causing immediate rash, facial/tongue/throat swelling, SOB or lightheadedness with hypotension: Yes Has patient had a PCN reaction causing severe rash involving mucus membranes or skin necrosis: Unknown Has patient had a PCN reaction that required hospitalization: No Has patient had a PCN reaction occurring within the last 10 years: No If all of the above answers are "NO", then may proceed with Cephalosporin use.   Sulfa Antibiotics Hives   Macrobid [nitrofurantoin] Rash   In mouth and pelvic area   Tetracyclines & Related Rash   In mouth and pelvic area      Medication List    STOP taking these medications   loperamide 2 MG capsule Commonly known as:  IMODIUM   oxyCODONE-acetaminophen 10-325 MG tablet Commonly known as:  PERCOCET Replaced by:  oxyCODONE-acetaminophen 7.5-325 MG tablet     TAKE these medications   albuterol 108 (90 Base) MCG/ACT inhaler Commonly known as:  PROVENTIL HFA;VENTOLIN HFA Inhale 2 puffs into the lungs every 6 (six) hours as needed for wheezing or shortness of breath.   aspirin EC 81 MG tablet Take 81 mg by mouth daily.   atorvastatin 80 MG tablet Commonly known as:  LIPITOR Take 80 mg by mouth at bedtime.   baclofen 10 MG tablet Commonly known as:  LIORESAL Take 10 mg by mouth 2 (two) times daily.   butalbital-acetaminophen-caffeine 50-325-40 MG tablet Commonly known as:  FIORICET, ESGIC Take 1 tablet by mouth 3 (three) times daily as needed for headache.   chlorthalidone 25 MG tablet Commonly known as:  HYGROTON Take 25 mg by mouth  daily.   cloNIDine 0.1 mg/24hr patch Commonly known as:  CATAPRES - Dosed in mg/24 hr Place 1 patch (0.1 mg total) onto the skin every 7 (seven) days.   docusate sodium 100 MG capsule Commonly known as:  COLACE Take 1 capsule (100 mg total) by mouth daily as needed.   DULoxetine 60 MG capsule Commonly known as:  CYMBALTA Take 60 mg by mouth 2 (two) times daily.   enoxaparin 40 MG/0.4ML injection Commonly known as:  LOVENOX Inject 0.4 mLs (40 mg total) into the skin daily for 24 days.   fluticasone 50 MCG/ACT nasal spray Commonly known as:  FLONASE Place 1 spray into both nostrils daily as needed for allergies or rhinitis.   hydroxychloroquine 200 MG tablet Commonly known as:  PLAQUENIL Take 200 mg by mouth 2 (two) times daily.   ibuprofen 800 MG tablet Commonly known as:  ADVIL,MOTRIN Take 1 tablet (800 mg total) by mouth 3 (three) times daily as needed for moderate pain. What changed:  when to take this   levocetirizine 5 MG tablet Commonly known as:  XYZAL Take 5 mg by mouth every evening.   lidocaine 5 % Commonly known as:  LIDODERM Place 1 patch onto the skin every 12 (twelve) hours for 7 days. Remove & Discard patch within 12 hours or as directed by MD   lisinopril 20 MG tablet Commonly known as:  PRINIVIL,ZESTRIL Take 20 mg by mouth daily.   metoprolol tartrate 50 MG tablet Commonly known as:  LOPRESSOR Take 50 mg by mouth 2 (two) times daily.   oxyCODONE-acetaminophen 7.5-325 MG tablet Commonly known as:  PERCOCET Take 2 tablets by mouth every 6 (six) hours as needed for moderate pain. Replaces:  oxyCODONE-acetaminophen 10-325 MG tablet   pantoprazole 40 MG tablet Commonly known as:  PROTONIX Take 40 mg by mouth daily.   primidone 50 MG tablet Commonly known as:  MYSOLINE Take by mouth 2 (two) times daily.   promethazine 12.5 MG tablet Commonly known as:  PHENERGAN Take 1 tablet (12.5 mg total) by mouth every 6 (six) hours as needed for nausea or  vomiting.   SUMAtriptan 25 MG tablet Commonly known as:  IMITREX Take 25 mg by mouth every 2 (two) hours as needed for migraine. May repeat in 2 hours if headache persists or recurs.   traZODone 100 MG tablet Commonly known as:  DESYREL Take 100 mg by mouth at bedtime.        Signed: Gwen Her Schermerhorn 08/18/2017, 11:36 AM

## 2017-08-30 ENCOUNTER — Encounter: Payer: Self-pay | Admitting: Student in an Organized Health Care Education/Training Program

## 2017-08-30 ENCOUNTER — Ambulatory Visit
Payer: Medicare Other | Attending: Student in an Organized Health Care Education/Training Program | Admitting: Student in an Organized Health Care Education/Training Program

## 2017-08-30 ENCOUNTER — Other Ambulatory Visit: Payer: Self-pay

## 2017-08-30 VITALS — BP 116/66 | HR 90 | Temp 98.3°F | Resp 20 | Ht 64.0 in | Wt 291.0 lb

## 2017-08-30 DIAGNOSIS — Z9049 Acquired absence of other specified parts of digestive tract: Secondary | ICD-10-CM | POA: Insufficient documentation

## 2017-08-30 DIAGNOSIS — M1990 Unspecified osteoarthritis, unspecified site: Secondary | ICD-10-CM | POA: Diagnosis not present

## 2017-08-30 DIAGNOSIS — F17210 Nicotine dependence, cigarettes, uncomplicated: Secondary | ICD-10-CM | POA: Insufficient documentation

## 2017-08-30 DIAGNOSIS — M329 Systemic lupus erythematosus, unspecified: Secondary | ICD-10-CM | POA: Diagnosis not present

## 2017-08-30 DIAGNOSIS — L930 Discoid lupus erythematosus: Secondary | ICD-10-CM

## 2017-08-30 DIAGNOSIS — G894 Chronic pain syndrome: Secondary | ICD-10-CM | POA: Insufficient documentation

## 2017-08-30 DIAGNOSIS — Z882 Allergy status to sulfonamides status: Secondary | ICD-10-CM | POA: Insufficient documentation

## 2017-08-30 DIAGNOSIS — Z6841 Body Mass Index (BMI) 40.0 and over, adult: Secondary | ICD-10-CM

## 2017-08-30 DIAGNOSIS — J449 Chronic obstructive pulmonary disease, unspecified: Secondary | ICD-10-CM | POA: Insufficient documentation

## 2017-08-30 DIAGNOSIS — M797 Fibromyalgia: Secondary | ICD-10-CM | POA: Diagnosis not present

## 2017-08-30 DIAGNOSIS — I2510 Atherosclerotic heart disease of native coronary artery without angina pectoris: Secondary | ICD-10-CM | POA: Insufficient documentation

## 2017-08-30 DIAGNOSIS — Z79899 Other long term (current) drug therapy: Secondary | ICD-10-CM | POA: Insufficient documentation

## 2017-08-30 DIAGNOSIS — Z888 Allergy status to other drugs, medicaments and biological substances status: Secondary | ICD-10-CM | POA: Insufficient documentation

## 2017-08-30 DIAGNOSIS — I252 Old myocardial infarction: Secondary | ICD-10-CM | POA: Insufficient documentation

## 2017-08-30 DIAGNOSIS — Z880 Allergy status to penicillin: Secondary | ICD-10-CM | POA: Insufficient documentation

## 2017-08-30 DIAGNOSIS — Z7982 Long term (current) use of aspirin: Secondary | ICD-10-CM | POA: Diagnosis not present

## 2017-08-30 DIAGNOSIS — Z881 Allergy status to other antibiotic agents status: Secondary | ICD-10-CM | POA: Diagnosis not present

## 2017-08-30 DIAGNOSIS — Z9889 Other specified postprocedural states: Secondary | ICD-10-CM | POA: Diagnosis not present

## 2017-08-30 DIAGNOSIS — G4730 Sleep apnea, unspecified: Secondary | ICD-10-CM | POA: Insufficient documentation

## 2017-08-30 DIAGNOSIS — I10 Essential (primary) hypertension: Secondary | ICD-10-CM | POA: Insufficient documentation

## 2017-08-30 MED ORDER — TOPIRAMATE 25 MG PO TABS: 25.0000 mg | ORAL_TABLET | Freq: Every day | ORAL | 2 refills | Status: DC

## 2017-08-30 NOTE — Progress Notes (Signed)
Safety precautions to be maintained throughout the outpatient stay will include: orient to surroundings, keep bed in low position, maintain call bell within reach at all times, provide assistance with transfer out of bed and ambulation.  

## 2017-08-30 NOTE — Patient Instructions (Signed)
1. Topamax 25 mg in the evening for 2 weeks and then increase to 25 mg BID  2. Follow up as needed

## 2017-08-30 NOTE — Progress Notes (Signed)
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 presents to establish care with a pain physician in the area.  Patient was previously under the care of pain management in [place].  She is status post a hysterectomy in early December and is still experiencing acute on chronic pain in the context of her postsurgical status and chronic lupus and fibromyalgia.  Patient was very emotional during our interview.  I question the patient's pain coping skills as well as  adaptive responses to her diffuse whole body pain.  Patient has a host of medical conditions for current age of 41.  I had an extensive discussion with her about evidence-based treatments for fibromyalgia which include diet modification, physical therapy including aquatic therapy which is beneficial for myofascial pain as well as membrane stabilizers and neuropathic agents.  In regards to medication trials, patient has tried gabapentin, Lyrica, Cymbalta.  She is currently on Cymbalta 60 mg twice daily.  In regards to the other medications, she is unable to recall the doses of these medications.  I  explained to her that it is hard for me to judge whether these medications were ineffective or if they simply did not reach a therapeutic dose.  Patient has not tried Topamax and I explained to her that this could be beneficial for her neuropathic pain, preventative therapy for migraines and could also assist with weight loss.  I explained to the patient how chronic opioid therapy can potentially worsen pain from fibromyalgia.  Furthermore I am concerned about the patient's morbid obesity and obstructive sleep apnea and opioid medications could potentially depress her respiratory drive and result in cardiorespiratory issues especially since the patient has a history of a myocardial infarction at the age of 59 and has a cardiac stent in place.  Had an extensive discussion with the patient about multimodal therapy for her fibromyalgia and lupus symptoms.  I offer the patient a referral for aquatic therapy and also for psychiatry to assist with her symptoms of depression and to assist with pain coping skills by way of cognitive behavioral therapies.  Patient was not interested and became tearful during our conversation when I told her that opioid medications would not be a treatment option for her at our clinic.  My concern is the patient's age, cardiorespiratory status, morbid obesity and the fact that chronic opioid therapy is not effective for fibromyalgia.  I instructed the patient that I am happy to support her through non-opioid means, physical therapy referral, psychotherapy by way of psychologist or psychiatrist.   Patient does not have any evidence or history of glaucoma or kidney stones to preclude Topamax therapy.  I have instructed her to start Topamax 25 mg nightly and then increase to 25 mg twice daily thereafter.  She is welcome to follow-up with me to manage her Topamax or she can follow-up with her primary care physician for this medication.  Plan: -will not be an opioid candidate at our clinic  given reasons mentioned above. -Instructed patient to continue her baclofen 10 mg twice daily, Fioricet as needed migraine treatment, Cymbalta 60 mg twice daily. -Start Topamax 25 mg nightly and increase to 25 mg twice daily for migraine prophylaxis and weight loss. -Recommend patient consider aquatic therapy after recovery from her hysterectomy.  I also recommended the patient consider referral to weight loss clinic which will also help her symptoms of fibromyalgia and diffuse whole body pain.    General Recommendations: The pain condition that the patient suffers from is best treated with a multidisciplinary approach that involves an increase in physical activity to prevent de-conditioning and worsening of the pain cycle, as well as psychological counseling (formal and/or informal) to address the co-morbid psychological affects of pain. Treatment will often involve judicious use of pain medications and interventional procedures to decrease the pain, allowing the patient to participate in the physical activity that will ultimately produce long-lasting pain reductions. The goal of the multidisciplinary approach is to return the patient to a  higher level of overall function and to restore their ability to perform activities of daily living.  Ordered Lab-work, Procedure(s), Referral(s), & Consult(s): No orders of the defined types were placed in this encounter.  Pharmacotherapy (current): Medications ordered:  Meds ordered this encounter  Medications  . topiramate (TOPAMAX) 25 MG tablet    Sig: Take 1 tablet (25 mg total) by mouth at bedtime.    Dispense:  30 tablet    Refill:  2   Medications administered during this visit: Rickey Barbara had no medications administered during this visit.   Pharmacological management options:  Opioid Analgesics: The patient was informed that there is no guarantee that she would be a candidate for opioid analgesics. The decision will be made following CDC  guidelines. This decision will be based on the results of diagnostic studies, as well as Ms. Corsi's risk profile.   Membrane stabilizer: Has tried gabapentin and Lyrica but they were not effective.  Patient unable to recall the dose.  Muscle relaxant: Currently on baclofen 10 mg twice daily  NSAID: Unable to take given GI symptoms of reflux  Other analgesic(s): To be determined at a later time    Provider-requested follow-up: Return if symptoms worsen or fail to improve.  Primary Care Physician: Kirk Ruths, MD Location: [facility] Note by: Gillis Santa, M.D, Date: 08/30/2017; Time: 1:43 PM  Patient Instructions  1. Topamax 25 mg in the evening for 2 weeks and then increase to 25 mg BID  2. Follow up as needed

## 2017-09-26 ENCOUNTER — Other Ambulatory Visit: Payer: Self-pay | Admitting: Obstetrics and Gynecology

## 2017-09-26 DIAGNOSIS — L7682 Other postprocedural complications of skin and subcutaneous tissue: Secondary | ICD-10-CM

## 2017-09-26 DIAGNOSIS — R102 Pelvic and perineal pain: Secondary | ICD-10-CM

## 2017-10-07 ENCOUNTER — Ambulatory Visit
Admission: RE | Admit: 2017-10-07 | Discharge: 2017-10-07 | Disposition: A | Discharge status: Home / Self Care | Payer: Medicare Other | Source: Ambulatory Visit | Attending: Obstetrics and Gynecology | Admitting: Obstetrics and Gynecology

## 2017-10-07 DIAGNOSIS — L7682 Other postprocedural complications of skin and subcutaneous tissue: Secondary | ICD-10-CM | POA: Diagnosis not present

## 2017-10-07 DIAGNOSIS — R102 Pelvic and perineal pain: Secondary | ICD-10-CM | POA: Insufficient documentation

## 2017-10-07 HISTORY — DX: Systemic involvement of connective tissue, unspecified: M35.9

## 2017-10-07 HISTORY — DX: Unspecified asthma, uncomplicated: J45.909

## 2017-10-07 MED ORDER — IOPAMIDOL (ISOVUE-300) INJECTION 61%
100.0000 mL | Freq: Once | INTRAVENOUS | Status: AC | PRN
  Administered 2017-10-07: 100 mL via INTRAVENOUS

## 2017-10-12 ENCOUNTER — Other Ambulatory Visit: Payer: Self-pay | Admitting: Obstetrics and Gynecology

## 2017-10-13 ENCOUNTER — Other Ambulatory Visit: Payer: Self-pay | Admitting: Obstetrics and Gynecology

## 2017-10-13 ENCOUNTER — Encounter: Payer: Medicare Other | Admitting: Nurse Practitioner

## 2017-10-13 DIAGNOSIS — S301XXA Contusion of abdominal wall, initial encounter: Secondary | ICD-10-CM

## 2017-10-18 ENCOUNTER — Ambulatory Visit
Admission: RE | Admit: 2017-10-18 | Discharge: 2017-10-18 | Disposition: A | Discharge status: Home / Self Care | Payer: Medicare Other | Source: Ambulatory Visit | Attending: Obstetrics and Gynecology | Admitting: Obstetrics and Gynecology

## 2017-10-18 DIAGNOSIS — L7634 Postprocedural seroma of skin and subcutaneous tissue following other procedure: Secondary | ICD-10-CM | POA: Diagnosis present

## 2017-10-18 DIAGNOSIS — S301XXA Contusion of abdominal wall, initial encounter: Secondary | ICD-10-CM

## 2017-10-18 NOTE — Procedures (Signed)
   Procedure:  US aspiration ant body wall postop collection 36ml thin beige opaque fluid -- sent for GS, C&S EBL:   minimal Complications:  none immediate  See full dictation in [employer].  Dillard Cannon MD Main # [phone] Pager  [phone]

## 2017-10-18 NOTE — Discharge Instructions (Signed)
Incision and Drainage, Care After Refer to this sheet in the next few weeks. These instructions provide you with information about caring for yourself after your procedure. Your health care provider may also give you more specific instructions. Your treatment has been planned according to current medical practices, but problems sometimes occur. Call your health care provider if you have any problems or questions after your procedure. What can I expect after the procedure? After the procedure, it is common to have:  Pain or discomfort around your incision site.  Drainage from your incision.  Follow these instructions at home:  Take over-the-counter and prescription medicines only as told by your health care provider.  If you were prescribed an antibiotic medicine, take it as told by your health care provider.Do not stop taking the antibiotic even if you start to feel better.  Followinstructions from your health care provider about: ? How to take care of your incision. ? When and how you should change your packing and bandage (dressing). Wash your hands with soap and water before you change your dressing. If soap and water are not available, use hand sanitizer. ? When you should remove your dressing.  Do not take baths, swim, or use a hot tub until your health care provider approves.  Keep all follow-up visits as told by your health care provider. This is important.  Check your incision area every day for signs of infection. Check for: ? More redness, swelling, or pain. ? More fluid or blood. ? Warmth. ? Pus or a bad smell. Contact a health care provider if:  Your cyst or abscess returns.  You have a fever.  You have more redness, swelling, or pain around your incision.  You have more fluid or blood coming from your incision.  Your incision feels warm to the touch.  You have pus or a bad smell coming from your incision. Get help right away if:  You have severe pain or  bleeding.  You cannot eat or drink without vomiting.  You have decreased urine output.  You become short of breath.  You have chest pain.  You cough up blood.  The area where the incision and drainage occurred becomes numb or it tingles. This information is not intended to replace advice given to you by your health care provider. Make sure you discuss any questions you have with your health care provider. Document Released: 11/22/2011 Document Revised: 01/30/2016 Document Reviewed: 06/20/2015 Elsevier Interactive Patient Education  2018 Elsevier Inc.  

## 2017-10-24 LAB — AEROBIC/ANAEROBIC CULTURE W GRAM STAIN (SURGICAL/DEEP WOUND): Special Requests: NORMAL

## 2017-10-24 LAB — AEROBIC/ANAEROBIC CULTURE (SURGICAL/DEEP WOUND): Culture: NEGATIVE

## 2018-01-16 ENCOUNTER — Ambulatory Visit
Admission: RE | Admit: 2018-01-16 | Discharge: 2018-01-16 | Disposition: A | Discharge status: Home / Self Care | Payer: Medicare Other | Source: Ambulatory Visit | Attending: Physician Assistant | Admitting: Physician Assistant

## 2018-01-16 ENCOUNTER — Other Ambulatory Visit: Payer: Self-pay | Admitting: Physician Assistant

## 2018-01-16 DIAGNOSIS — M25562 Pain in left knee: Secondary | ICD-10-CM

## 2018-01-16 DIAGNOSIS — G8929 Other chronic pain: Secondary | ICD-10-CM

## 2018-01-16 DIAGNOSIS — M25561 Pain in right knee: Secondary | ICD-10-CM | POA: Diagnosis present

## 2018-01-16 DIAGNOSIS — M797 Fibromyalgia: Secondary | ICD-10-CM

## 2018-01-16 DIAGNOSIS — M170 Bilateral primary osteoarthritis of knee: Secondary | ICD-10-CM | POA: Insufficient documentation

## 2018-01-16 DIAGNOSIS — M542 Cervicalgia: Secondary | ICD-10-CM | POA: Insufficient documentation

## 2018-01-16 DIAGNOSIS — I011 Acute rheumatic endocarditis: Secondary | ICD-10-CM | POA: Diagnosis not present

## 2018-01-18 ENCOUNTER — Encounter: Payer: Self-pay | Admitting: *Deleted

## 2018-01-18 ENCOUNTER — Ambulatory Visit: Payer: Medicare Other | Admitting: Certified Registered Nurse Anesthetist

## 2018-01-18 ENCOUNTER — Encounter
Admission: RE | Disposition: A | Discharge status: Home / Self Care | Payer: Self-pay | Source: Ambulatory Visit | Attending: Internal Medicine

## 2018-01-18 ENCOUNTER — Ambulatory Visit
Admission: RE | Admit: 2018-01-18 | Discharge: 2018-01-18 | Disposition: A | Discharge status: Home / Self Care | Payer: Medicare Other | Source: Ambulatory Visit | Attending: Internal Medicine | Admitting: Internal Medicine

## 2018-01-18 DIAGNOSIS — K640 First degree hemorrhoids: Secondary | ICD-10-CM | POA: Diagnosis not present

## 2018-01-18 DIAGNOSIS — M47812 Spondylosis without myelopathy or radiculopathy, cervical region: Secondary | ICD-10-CM | POA: Diagnosis not present

## 2018-01-18 DIAGNOSIS — I2510 Atherosclerotic heart disease of native coronary artery without angina pectoris: Secondary | ICD-10-CM | POA: Diagnosis not present

## 2018-01-18 DIAGNOSIS — R194 Change in bowel habit: Secondary | ICD-10-CM | POA: Insufficient documentation

## 2018-01-18 DIAGNOSIS — M19071 Primary osteoarthritis, right ankle and foot: Secondary | ICD-10-CM | POA: Insufficient documentation

## 2018-01-18 DIAGNOSIS — I10 Essential (primary) hypertension: Secondary | ICD-10-CM | POA: Insufficient documentation

## 2018-01-18 DIAGNOSIS — M170 Bilateral primary osteoarthritis of knee: Secondary | ICD-10-CM | POA: Diagnosis not present

## 2018-01-18 DIAGNOSIS — I252 Old myocardial infarction: Secondary | ICD-10-CM | POA: Diagnosis not present

## 2018-01-18 DIAGNOSIS — M359 Systemic involvement of connective tissue, unspecified: Secondary | ICD-10-CM | POA: Insufficient documentation

## 2018-01-18 DIAGNOSIS — G4730 Sleep apnea, unspecified: Secondary | ICD-10-CM | POA: Diagnosis not present

## 2018-01-18 DIAGNOSIS — G250 Essential tremor: Secondary | ICD-10-CM | POA: Insufficient documentation

## 2018-01-18 DIAGNOSIS — K219 Gastro-esophageal reflux disease without esophagitis: Secondary | ICD-10-CM | POA: Diagnosis not present

## 2018-01-18 DIAGNOSIS — M797 Fibromyalgia: Secondary | ICD-10-CM | POA: Insufficient documentation

## 2018-01-18 DIAGNOSIS — Z8719 Personal history of other diseases of the digestive system: Secondary | ICD-10-CM | POA: Insufficient documentation

## 2018-01-18 DIAGNOSIS — Z79899 Other long term (current) drug therapy: Secondary | ICD-10-CM | POA: Diagnosis not present

## 2018-01-18 DIAGNOSIS — Z888 Allergy status to other drugs, medicaments and biological substances status: Secondary | ICD-10-CM | POA: Insufficient documentation

## 2018-01-18 DIAGNOSIS — J449 Chronic obstructive pulmonary disease, unspecified: Secondary | ICD-10-CM | POA: Diagnosis not present

## 2018-01-18 DIAGNOSIS — M19072 Primary osteoarthritis, left ankle and foot: Secondary | ICD-10-CM | POA: Insufficient documentation

## 2018-01-18 DIAGNOSIS — Z7982 Long term (current) use of aspirin: Secondary | ICD-10-CM | POA: Insufficient documentation

## 2018-01-18 DIAGNOSIS — R102 Pelvic and perineal pain: Secondary | ICD-10-CM | POA: Diagnosis not present

## 2018-01-18 DIAGNOSIS — F419 Anxiety disorder, unspecified: Secondary | ICD-10-CM | POA: Insufficient documentation

## 2018-01-18 DIAGNOSIS — M329 Systemic lupus erythematosus, unspecified: Secondary | ICD-10-CM | POA: Diagnosis not present

## 2018-01-18 DIAGNOSIS — K589 Irritable bowel syndrome without diarrhea: Secondary | ICD-10-CM | POA: Insufficient documentation

## 2018-01-18 DIAGNOSIS — F17200 Nicotine dependence, unspecified, uncomplicated: Secondary | ICD-10-CM | POA: Diagnosis not present

## 2018-01-18 DIAGNOSIS — Z8711 Personal history of peptic ulcer disease: Secondary | ICD-10-CM | POA: Insufficient documentation

## 2018-01-18 DIAGNOSIS — E785 Hyperlipidemia, unspecified: Secondary | ICD-10-CM | POA: Diagnosis not present

## 2018-01-18 DIAGNOSIS — Z882 Allergy status to sulfonamides status: Secondary | ICD-10-CM | POA: Insufficient documentation

## 2018-01-18 DIAGNOSIS — R109 Unspecified abdominal pain: Secondary | ICD-10-CM | POA: Insufficient documentation

## 2018-01-18 DIAGNOSIS — Z9989 Dependence on other enabling machines and devices: Secondary | ICD-10-CM | POA: Insufficient documentation

## 2018-01-18 DIAGNOSIS — Z881 Allergy status to other antibiotic agents status: Secondary | ICD-10-CM | POA: Insufficient documentation

## 2018-01-18 DIAGNOSIS — Z880 Allergy status to penicillin: Secondary | ICD-10-CM | POA: Insufficient documentation

## 2018-01-18 HISTORY — DX: Irritable bowel syndrome, unspecified: K58.9

## 2018-01-18 HISTORY — DX: Hyperlipidemia, unspecified: E78.5

## 2018-01-18 HISTORY — PX: COLONOSCOPY WITH PROPOFOL: SHX5780

## 2018-01-18 HISTORY — DX: Gastro-esophageal reflux disease without esophagitis: K21.9

## 2018-01-18 HISTORY — DX: Gastric ulcer, unspecified as acute or chronic, without hemorrhage or perforation: K25.9

## 2018-01-18 SURGERY — COLONOSCOPY WITH PROPOFOL
Anesthesia: General

## 2018-01-18 MED ORDER — LIDOCAINE HCL (PF) 2 % IJ SOLN
INTRAMUSCULAR | Status: AC
  Filled 2018-01-18: qty 10

## 2018-01-18 MED ORDER — PROPOFOL 500 MG/50ML IV EMUL
INTRAVENOUS | Status: DC | PRN
  Administered 2018-01-18: 200 ug/kg/min via INTRAVENOUS

## 2018-01-18 MED ORDER — PROPOFOL 10 MG/ML IV BOLUS
INTRAVENOUS | Status: DC | PRN
  Administered 2018-01-18: 80 mg via INTRAVENOUS
  Administered 2018-01-18: 20 mg via INTRAVENOUS

## 2018-01-18 MED ORDER — PROPOFOL 10 MG/ML IV BOLUS
INTRAVENOUS | Status: AC
  Filled 2018-01-18: qty 20

## 2018-01-18 MED ORDER — SODIUM CHLORIDE 0.9 % IV SOLN
INTRAVENOUS | Status: DC
  Administered 2018-01-18: 1000 mL via INTRAVENOUS

## 2018-01-18 MED ORDER — LIDOCAINE HCL (CARDIAC) PF 100 MG/5ML IV SOSY
PREFILLED_SYRINGE | INTRAVENOUS | Status: DC | PRN
  Administered 2018-01-18: 50 mg via INTRAVENOUS

## 2018-01-18 NOTE — Op Note (Signed)
[facility] Gastroenterology Patient Name: [name] Procedure Date: 01/18/2018 11:15 AM MRN: [id number] Account #: [id number] Date of Birth: [birth year] Admit Type: Outpatient Age: 42 Room: [facility] ENDO ROOM 4 Gender: Female Note Status: Finalized Procedure:            Colonoscopy Indications:          Incidental abdominal pain noted, Incidental change in                        bowel habits noted Providers:            Benay Pike. Alice Reichert MD, MD Referring MD:         Ocie Cornfield. Ouida Sills MD, MD (Referring MD) Medicines:            Propofol per Anesthesia Complications:        No immediate complications. Procedure:            Pre-Anesthesia Assessment:                       - The risks and benefits of the procedure and the                        sedation options and risks were discussed with the                        patient. All questions were answered and informed                        consent was obtained.                       - Patient identification and proposed procedure were                        verified prior to the procedure by the nurse. The                        procedure was verified in the procedure room.                       - ASA Grade Assessment: II - A patient with mild                        systemic disease.                       - After reviewing the risks and benefits, the patient                        was deemed in satisfactory condition to undergo the                        procedure.                       After obtaining informed consent, the colonoscope was                        passed under direct vision. Throughout the procedure,  the patient's blood pressure, pulse, and oxygen                        saturations were monitored continuously. The                        Colonoscope was introduced through the anus and                        advanced to the the cecum, identified by appendiceal     orifice and ileocecal valve. The colonoscopy was                        performed without difficulty. The patient tolerated the                        procedure well. The quality of the bowel preparation                        was good. The ileocecal valve, appendiceal orifice, and                        rectum were photographed. The colonoscopy was performed                        without difficulty. The patient tolerated the procedure                        well. Findings:      The perianal and digital rectal examinations were normal. Pertinent       negatives include normal sphincter tone and no palpable rectal lesions.      The colon (entire examined portion) appeared normal.      Non-bleeding internal hemorrhoids were found during retroflexion. The       hemorrhoids were Grade I (internal hemorrhoids that do not prolapse).      The exam was otherwise without abnormality. Impression:           - The entire examined colon is normal.                       - Non-bleeding internal hemorrhoids.                       - The examination was otherwise normal.                       - No specimens collected. Recommendation:       - Patient has a contact number available for                        emergencies. The signs and symptoms of potential                        delayed complications were discussed with the patient.                        Return to normal activities tomorrow. Written discharge                        instructions were provided to the patient.                       -  Resume previous diet.                       - Continue present medications.                       - Repeat colonoscopy in 7 years for screening purposes.                       - Return to physician assistant in 6 months.                       - The findings and recommendations were discussed with                        the patient and their family. Procedure Code(s):    --- Professional ---                        [phone], Colonoscopy, flexible; diagnostic, including                        collection of specimen(s) by brushing or washing, when                        performed (separate procedure) Diagnosis Code(s):    --- Professional ---                       K64.0, First degree hemorrhoids CPT copyright 2017 American Medical Association. All rights reserved. The codes documented in this report are preliminary and upon coder review may  be revised to meet current compliance requirements. Efrain Sella MD, MD 01/18/2018 11:38:42 AM This report has been signed electronically. Number of Addenda: 0 Note Initiated On: 01/18/2018 11:15 AM Scope Withdrawal Time: 0 hours 8 minutes 59 seconds  Total Procedure Duration: 0 hours 12 minutes 21 seconds       [facility]

## 2018-01-18 NOTE — Anesthesia Preprocedure Evaluation (Signed)
Anesthesia Evaluation  Patient identified by MRN, date of birth, ID band Patient awake    Reviewed: Allergy & Precautions, H&P , NPO status , Patient's Chart, lab work & pertinent test results, reviewed documented beta blocker date and time   Airway Mallampati: II   Neck ROM: full    Dental  (+) Poor Dentition, Teeth Intact   Pulmonary neg pulmonary ROS, asthma , sleep apnea , COPD, Current Smoker,    Pulmonary exam normal        Cardiovascular Exercise Tolerance: Poor hypertension, On Medications + CAD and + Past MI  negative cardio ROS Normal cardiovascular exam Rhythm:regular Rate:Normal     Neuro/Psych  Headaches, Anxiety  Neuromuscular disease negative neurological ROS  negative psych ROS   GI/Hepatic negative GI ROS, Neg liver ROS, PUD, GERD  ,  Endo/Other  negative endocrine ROS  Renal/GU negative Renal ROS  negative genitourinary   Musculoskeletal   Abdominal   Peds  Hematology negative hematology ROS (+)   Anesthesia Other Findings Past Medical History: No date: Anxiety No date: Arthritis     Comment:  back, knees , neck and feet No date: Asthma No date: Collagen vascular disease ([place]) 03/2017: COPD (chronic obstructive pulmonary disease) ([facility])     Comment:  mild No date: Coronary artery disease No date: Endometriosis No date: Fibromyalgia No date: Gastric ulcer No date: GERD (gastroesophageal reflux disease) No date: Headache     Comment:  migraines No date: Hyperlipidemia No date: Hypertension No date: IBS (irritable bowel syndrome) No date: Lupus ([place]) 08/2009: Myocardial infarction ([place]) No date: Sleep apnea     Comment:  uses bipap No date: Tremors of nervous system     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 Past Surgical History: No date: ABDOMINAL HYSTERECTOMY 2010: CARDIAC CATHETERIZATION     Comment:  stent x 1 2006: CESAREAN SECTION 2008: CHOLECYSTECTOMY 04/2000: DIAGNOSTIC  LAPAROSCOPY 04/1997 missed ab: [place] 2013: [place]; Right 1998: MULTIPLE TOOTH EXTRACTIONS     Comment:  4 wisdom teeth 08/15/2017: UTERINE STENT PLACEMENT; Bilateral     Comment:  Procedure: UTERINE STENT PLACEMENT;  Surgeon:               Boykin Nearing, MD;  Location: [facility] ORS;                Service: Gynecology;  Laterality: Bilateral; BMI    Body Mass Index:  53.14 kg/m     Reproductive/Obstetrics negative OB ROS                             Anesthesia Physical Anesthesia Plan  ASA: III  Anesthesia Plan: General   Post-op Pain Management:    Induction:   PONV Risk Score and Plan:   Airway Management Planned:   Additional Equipment:   Intra-op Plan:   Post-operative Plan:   Informed Consent: I have reviewed the patients History and Physical, chart, labs and discussed the procedure including the risks, benefits and alternatives for the proposed anesthesia with the patient or authorized representative who has indicated his/her understanding and acceptance.   Dental Advisory Given  Plan Discussed with: CRNA  Anesthesia Plan Comments:         Anesthesia Quick Evaluation

## 2018-01-18 NOTE — Anesthesia Postprocedure Evaluation (Signed)
Anesthesia Post Note  Patient: [name]  Procedure(s) Performed: COLONOSCOPY WITH PROPOFOL (N/A )  Patient location during evaluation: PACU Anesthesia Type: General Level of consciousness: awake and alert Pain management: pain level controlled Vital Signs Assessment: post-procedure vital signs reviewed and stable Respiratory status: spontaneous breathing, nonlabored ventilation, respiratory function stable and patient connected to nasal cannula oxygen Cardiovascular status: blood pressure returned to baseline and stable Postop Assessment: no apparent nausea or vomiting Anesthetic complications: no     Last Vitals:  Vitals:   01/18/18 1150 01/18/18 1200  BP: 123/76 (!) 129/94  Pulse: 74 84  Resp: (!) 21 (!) 26  Temp:    SpO2: 98% 99%    Last Pain:  Vitals:   01/18/18 1200  TempSrc:   PainSc: 0-No pain                 Molli Barrows

## 2018-01-18 NOTE — Anesthesia Post-op Follow-up Note (Signed)
Anesthesia QCDR form completed.        

## 2018-01-18 NOTE — Interval H&P Note (Signed)
History and Physical Interval Note:  01/18/2018 11:07 AM  [name]  has presented today for surgery, with the diagnosis of IBS  The various methods of treatment have been discussed with the patient and family. After consideration of risks, benefits and other options for treatment, the patient has consented to  Procedure(s): COLONOSCOPY WITH PROPOFOL (N/A) as a surgical intervention .  The patient's history has been reviewed, patient examined, no change in status, stable for surgery.  I have reviewed the patient's chart and labs.  Questions were answered to the patient's satisfaction.     [place], [place]

## 2018-01-18 NOTE — H&P (Signed)
Outpatient short stay form Pre-procedure 01/18/2018 11:02 AM Teodoro K. Alice Reichert, M.D.  Primary Physician: Frazier Richards, M.D.  Reason for visit:  Pelvic pain, change in bowel habits, irritable bowel syndrome.  History of present illness:  Patient with persistent pelvic pain has hx endometriosis and is s/p hysterectomy. Still has pain with altered diarrhea and constipation. Denies hematochezia currently. Stool change from "runny" to "too solid" and she strains to have a BM sometimes.    Current Facility-Administered Medications:  .  0.9 %  sodium chloride infusion, , Intravenous, Continuous, [place], Benay Pike, MD, Last Rate: 20 mL/hr at 01/18/18 1028, 1,000 mL at 01/18/18 1028  Medications Prior to Admission  Medication Sig Dispense Refill Last Dose  . gabapentin (NEURONTIN) 300 MG capsule Take 600 mg by mouth 3 (three) times daily.     Marland Kitchen lisinopril (PRINIVIL,ZESTRIL) 20 MG tablet Take 20 mg by mouth daily.   01/17/2018 at Unknown time  . metoprolol tartrate (LOPRESSOR) 50 MG tablet Take 50 mg by mouth 2 (two) times daily.   01/17/2018 at Unknown time  . montelukast (SINGULAIR) 10 MG tablet Take 10 mg by mouth at bedtime.     Marland Kitchen albuterol (PROVENTIL HFA;VENTOLIN HFA) 108 (90 Base) MCG/ACT inhaler Inhale 2 puffs into the lungs every 6 (six) hours as needed for wheezing or shortness of breath.   Taking  . aspirin EC 81 MG tablet Take 81 mg by mouth daily.   Taking  . atorvastatin (LIPITOR) 80 MG tablet Take 80 mg by mouth at bedtime.   Taking  . butalbital-acetaminophen-caffeine (FIORICET, ESGIC) 50-325-40 MG tablet Take 1 tablet by mouth 3 (three) times daily as needed for headache.   Taking  . butalbital-acetaminophen-caffeine (FIORICET/CODEINE) 50-325-40-30 MG capsule Take 1 capsule by mouth every 8 (eight) hours as needed for headache.   Taking  . cefUROXime (CEFTIN) 500 MG tablet Take 500 mg by mouth 2 (two) times daily with a meal.   Taking  . chlorthalidone (HYGROTON) 25 MG tablet Take 25  mg by mouth daily.   Taking  . cloNIDine (CATAPRES - DOSED IN MG/24 HR) 0.1 mg/24hr patch Place 1 patch (0.1 mg total) onto the skin every 7 (seven) days. 4 patch 11 Taking  . DULoxetine (CYMBALTA) 60 MG capsule Take 60 mg by mouth 2 (two) times daily.  0 Taking  . hydroxychloroquine (PLAQUENIL) 200 MG tablet Take 200 mg by mouth 2 (two) times daily.  1 Taking  . ibuprofen (ADVIL,MOTRIN) 800 MG tablet Take 1 tablet (800 mg total) by mouth 3 (three) times daily as needed for moderate pain. 30 tablet 0 Taking  . levocetirizine (XYZAL) 5 MG tablet Take 5 mg by mouth every evening.   Taking  . oxyCODONE-acetaminophen (PERCOCET) 10-325 MG tablet Take 1 tablet by mouth every 4 (four) hours as needed for pain.   Taking  . oxyCODONE-acetaminophen (PERCOCET) 7.5-325 MG tablet Take 2 tablets by mouth every 6 (six) hours as needed for moderate pain. 30 tablet 0 Taking  . pantoprazole (PROTONIX) 40 MG tablet Take 40 mg by mouth daily.   Taking  . primidone (MYSOLINE) 50 MG tablet Take by mouth 2 (two) times daily.   Taking  . promethazine (PHENERGAN) 12.5 MG tablet Take 1 tablet (12.5 mg total) by mouth every 6 (six) hours as needed for nausea or vomiting. 30 tablet 0 Taking  . SUMAtriptan (IMITREX) 25 MG tablet Take 25 mg by mouth every 2 (two) hours as needed for migraine. May repeat in 2 hours if headache  persists or recurs.   Taking  . topiramate (TOPAMAX) 25 MG tablet Take 1 tablet (25 mg total) by mouth at bedtime. 30 tablet 2   . traZODone (DESYREL) 100 MG tablet Take 100 mg by mouth at bedtime.   Taking     Allergies  Allergen Reactions  . Avelox [Moxifloxacin Hcl In Nacl] Nausea And Vomiting  . Levaquin [Levofloxacin In D5w]     Makes me achey all over  . Penicillins Hives    Has patient had a PCN reaction causing immediate rash, facial/tongue/throat swelling, SOB or lightheadedness with hypotension: Yes Has patient had a PCN reaction causing severe rash involving mucus membranes or skin  necrosis: Unknown Has patient had a PCN reaction that required hospitalization: No Has patient had a PCN reaction occurring within the last 10 years: No If all of the above answers are "NO", then may proceed with Cephalosporin use.   . Sulfa Antibiotics Hives  . Macrobid [Nitrofurantoin] Rash    In mouth and pelvic area  . Tetracyclines & Related Rash    In mouth and pelvic area     Past Medical History:  Diagnosis Date  . Anxiety   . Arthritis    back, knees , neck and feet  . Asthma   . Collagen vascular disease ([place])   . COPD (chronic obstructive pulmonary disease) ([place]) 03/2017   mild  . Coronary artery disease   . Endometriosis   . Fibromyalgia   . Gastric ulcer   . GERD (gastroesophageal reflux disease)   . Headache    migraines  . Hyperlipidemia   . Hypertension   . IBS (irritable bowel syndrome)   . Lupus ([place])   . Myocardial infarction ([place]) 08/2009  . Sleep apnea    uses bipap  . 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   Physical Exam  Gen: Alert, oriented. Appears stated age.  HEENT: [place]/AT. PERRLA. Lungs: CTA, no wheezes. CV: RR nl S1, S2. Abd: soft, benign, no masses. BS+ Ext: No edema. Pulses 2+    Planned procedures: Colonoscopy. The patient understands the nature of the planned procedure, indications, risks, alternatives and potential complications including but not limited to bleeding, infection, perforation, damage to internal organs and possible oversedation/side effects from anesthesia. The patient agrees and gives consent to proceed.  Please refer to procedure notes for findings, recommendations and patient disposition/instructions.    Teodoro K. Alice Reichert, M.D. Gastroenterology 01/18/2018  11:02 AM

## 2018-01-18 NOTE — Transfer of Care (Signed)
Immediate Anesthesia Transfer of Care Note  Patient: [name]  Procedure(s) Performed: COLONOSCOPY WITH PROPOFOL (N/A )  Patient Location: PACU and Endoscopy Unit  Anesthesia Type:General  Level of Consciousness: drowsy  Airway & Oxygen Therapy: Patient Spontanous Breathing  Post-op Assessment: Report given to RN  Post vital signs: stable  Last Vitals:  Vitals Value Taken Time  BP    Temp    Pulse    Resp    SpO2      Last Pain:  Vitals:   01/18/18 1010  TempSrc: Tympanic  PainSc: 10-Worst pain ever         Complications: No apparent anesthesia complications

## 2018-01-19 ENCOUNTER — Encounter: Payer: Self-pay | Admitting: Internal Medicine

## 2018-02-15 ENCOUNTER — Other Ambulatory Visit
Admission: RE | Admit: 2018-02-15 | Discharge: 2018-02-15 | Disposition: A | Discharge status: Home / Self Care | Payer: Medicare Other | Source: Ambulatory Visit | Attending: Internal Medicine | Admitting: Internal Medicine

## 2018-02-15 ENCOUNTER — Other Ambulatory Visit (HOSPITAL_COMMUNITY): Payer: Self-pay | Admitting: Internal Medicine

## 2018-02-15 ENCOUNTER — Ambulatory Visit
Admission: RE | Admit: 2018-02-15 | Discharge: 2018-02-15 | Disposition: A | Discharge status: Home / Self Care | Payer: Medicare Other | Source: Ambulatory Visit | Attending: Internal Medicine | Admitting: Internal Medicine

## 2018-02-15 DIAGNOSIS — R0602 Shortness of breath: Secondary | ICD-10-CM | POA: Insufficient documentation

## 2018-02-15 DIAGNOSIS — R0989 Other specified symptoms and signs involving the circulatory and respiratory systems: Secondary | ICD-10-CM | POA: Insufficient documentation

## 2018-02-15 DIAGNOSIS — I2510 Atherosclerotic heart disease of native coronary artery without angina pectoris: Secondary | ICD-10-CM | POA: Diagnosis not present

## 2018-02-15 DIAGNOSIS — R7989 Other specified abnormal findings of blood chemistry: Secondary | ICD-10-CM

## 2018-02-15 DIAGNOSIS — R42 Dizziness and giddiness: Secondary | ICD-10-CM

## 2018-02-15 DIAGNOSIS — I700 Atherosclerosis of aorta: Secondary | ICD-10-CM | POA: Diagnosis not present

## 2018-02-15 DIAGNOSIS — R55 Syncope and collapse: Secondary | ICD-10-CM | POA: Insufficient documentation

## 2018-02-15 DIAGNOSIS — I517 Cardiomegaly: Secondary | ICD-10-CM | POA: Diagnosis not present

## 2018-02-15 HISTORY — DX: Malignant (primary) neoplasm, unspecified: C80.1

## 2018-02-15 LAB — TROPONIN I: Troponin I: <0.03 ng/mL (ref <0.03)

## 2018-02-15 LAB — FIBRIN DERIVATIVES D-DIMER (ARMC ONLY): Fibrin derivatives D-dimer (ARMC): 1413.28 ng/mL (FEU) — ABNORMAL HIGH (ref 0.00–499.00)

## 2018-02-15 MED ORDER — IOHEXOL 350 MG/ML SOLN
75.0000 mL | Freq: Once | INTRAVENOUS | Status: AC | PRN
  Administered 2018-02-15: 75 mL via INTRAVENOUS

## 2018-02-16 ENCOUNTER — Ambulatory Visit
Admission: RE | Admit: 2018-02-16 | Discharge: 2018-02-16 | Disposition: A | Discharge status: Home / Self Care | Payer: Medicare Other | Source: Ambulatory Visit | Attending: Internal Medicine | Admitting: Internal Medicine

## 2018-02-16 ENCOUNTER — Other Ambulatory Visit: Payer: Self-pay | Admitting: Internal Medicine

## 2018-02-16 DIAGNOSIS — M79604 Pain in right leg: Secondary | ICD-10-CM

## 2018-02-16 DIAGNOSIS — M79605 Pain in left leg: Principal | ICD-10-CM

## 2018-02-16 DIAGNOSIS — M79671 Pain in right foot: Principal | ICD-10-CM

## 2018-02-16 DIAGNOSIS — M79672 Pain in left foot: Principal | ICD-10-CM

## 2018-02-27 ENCOUNTER — Other Ambulatory Visit: Payer: Self-pay | Admitting: Obstetrics & Gynecology

## 2018-02-28 ENCOUNTER — Other Ambulatory Visit: Payer: Self-pay | Admitting: Obstetrics & Gynecology

## 2018-02-28 DIAGNOSIS — R102 Pelvic and perineal pain: Secondary | ICD-10-CM

## 2018-02-28 DIAGNOSIS — Z90710 Acquired absence of both cervix and uterus: Secondary | ICD-10-CM

## 2018-03-02 ENCOUNTER — Other Ambulatory Visit: Payer: Self-pay | Admitting: Nurse Practitioner

## 2018-03-02 DIAGNOSIS — R519 Headache, unspecified: Secondary | ICD-10-CM

## 2018-03-02 DIAGNOSIS — R51 Headache: Principal | ICD-10-CM

## 2018-03-03 ENCOUNTER — Other Ambulatory Visit (HOSPITAL_COMMUNITY): Payer: Self-pay | Admitting: Nurse Practitioner

## 2018-03-03 ENCOUNTER — Other Ambulatory Visit: Payer: Self-pay | Admitting: Nurse Practitioner

## 2018-03-03 DIAGNOSIS — R51 Headache: Principal | ICD-10-CM

## 2018-03-03 DIAGNOSIS — R519 Headache, unspecified: Secondary | ICD-10-CM

## 2018-03-04 ENCOUNTER — Ambulatory Visit (HOSPITAL_COMMUNITY)
Admission: RE | Admit: 2018-03-04 | Discharge: 2018-03-04 | Disposition: A | Discharge status: Home / Self Care | Payer: Medicare Other | Source: Ambulatory Visit | Attending: Nurse Practitioner | Admitting: Nurse Practitioner

## 2018-03-04 DIAGNOSIS — D320 Benign neoplasm of cerebral meninges: Secondary | ICD-10-CM | POA: Diagnosis not present

## 2018-03-04 DIAGNOSIS — R51 Headache: Secondary | ICD-10-CM | POA: Insufficient documentation

## 2018-03-04 DIAGNOSIS — R419 Unspecified symptoms and signs involving cognitive functions and awareness: Secondary | ICD-10-CM | POA: Diagnosis not present

## 2018-03-04 DIAGNOSIS — R519 Headache, unspecified: Secondary | ICD-10-CM

## 2018-03-04 MED ORDER — GADOBENATE DIMEGLUMINE 529 MG/ML IV SOLN
20.0000 mL | Freq: Once | INTRAVENOUS | Status: AC | PRN
  Administered 2018-03-04: 20 mL via INTRAVENOUS

## 2018-03-07 ENCOUNTER — Ambulatory Visit
Admission: RE | Admit: 2018-03-07 | Discharge: 2018-03-07 | Disposition: A | Discharge status: Home / Self Care | Payer: Medicare Other | Source: Ambulatory Visit | Attending: Obstetrics & Gynecology | Admitting: Obstetrics & Gynecology

## 2018-03-07 DIAGNOSIS — R102 Pelvic and perineal pain: Secondary | ICD-10-CM | POA: Diagnosis present

## 2018-03-07 DIAGNOSIS — Z90710 Acquired absence of both cervix and uterus: Secondary | ICD-10-CM

## 2018-03-07 MED ORDER — IOPAMIDOL (ISOVUE-370) INJECTION 76%
100.0000 mL | Freq: Once | INTRAVENOUS | Status: AC | PRN
  Administered 2018-03-07: 100 mL via INTRAVENOUS

## 2018-03-15 ENCOUNTER — Ambulatory Visit: Payer: Medicare Other

## 2018-03-23 ENCOUNTER — Other Ambulatory Visit: Payer: Self-pay | Admitting: Pain Medicine

## 2018-03-23 ENCOUNTER — Other Ambulatory Visit (HOSPITAL_COMMUNITY): Payer: Self-pay | Admitting: Pain Medicine

## 2018-03-23 DIAGNOSIS — M542 Cervicalgia: Secondary | ICD-10-CM

## 2018-03-31 ENCOUNTER — Ambulatory Visit
Admission: RE | Admit: 2018-03-31 | Discharge: 2018-03-31 | Disposition: A | Discharge status: Home / Self Care | Payer: Medicare Other | Source: Ambulatory Visit | Attending: Pain Medicine | Admitting: Pain Medicine

## 2018-03-31 DIAGNOSIS — M542 Cervicalgia: Secondary | ICD-10-CM | POA: Insufficient documentation

## 2018-03-31 DIAGNOSIS — M1288 Other specific arthropathies, not elsewhere classified, other specified site: Secondary | ICD-10-CM | POA: Diagnosis not present

## 2018-04-25 DIAGNOSIS — D329 Benign neoplasm of meninges, unspecified: Secondary | ICD-10-CM | POA: Diagnosis not present

## 2018-05-02 DIAGNOSIS — G4733 Obstructive sleep apnea (adult) (pediatric): Secondary | ICD-10-CM | POA: Diagnosis not present

## 2018-05-02 DIAGNOSIS — M25522 Pain in left elbow: Secondary | ICD-10-CM | POA: Diagnosis not present

## 2018-05-02 DIAGNOSIS — R251 Tremor, unspecified: Secondary | ICD-10-CM | POA: Diagnosis not present

## 2018-05-02 DIAGNOSIS — M797 Fibromyalgia: Secondary | ICD-10-CM | POA: Diagnosis not present

## 2018-05-02 DIAGNOSIS — E118 Type 2 diabetes mellitus with unspecified complications: Secondary | ICD-10-CM | POA: Diagnosis not present

## 2018-05-02 DIAGNOSIS — M329 Systemic lupus erythematosus, unspecified: Secondary | ICD-10-CM | POA: Diagnosis not present

## 2018-05-02 DIAGNOSIS — M0609 Rheumatoid arthritis without rheumatoid factor, multiple sites: Secondary | ICD-10-CM | POA: Diagnosis not present

## 2018-05-03 DIAGNOSIS — M1710 Unilateral primary osteoarthritis, unspecified knee: Secondary | ICD-10-CM | POA: Diagnosis not present

## 2018-05-03 DIAGNOSIS — M47812 Spondylosis without myelopathy or radiculopathy, cervical region: Secondary | ICD-10-CM | POA: Diagnosis not present

## 2018-05-03 DIAGNOSIS — G894 Chronic pain syndrome: Secondary | ICD-10-CM | POA: Diagnosis not present

## 2018-05-03 DIAGNOSIS — M797 Fibromyalgia: Secondary | ICD-10-CM | POA: Diagnosis not present

## 2018-05-09 DIAGNOSIS — E118 Type 2 diabetes mellitus with unspecified complications: Secondary | ICD-10-CM | POA: Diagnosis not present

## 2018-05-09 DIAGNOSIS — J449 Chronic obstructive pulmonary disease, unspecified: Secondary | ICD-10-CM | POA: Diagnosis not present

## 2018-05-09 DIAGNOSIS — I25119 Atherosclerotic heart disease of native coronary artery with unspecified angina pectoris: Secondary | ICD-10-CM | POA: Diagnosis not present

## 2018-05-09 DIAGNOSIS — Z6841 Body Mass Index (BMI) 40.0 and over, adult: Secondary | ICD-10-CM | POA: Diagnosis not present

## 2018-05-09 DIAGNOSIS — Z0000 Encounter for general adult medical examination without abnormal findings: Secondary | ICD-10-CM | POA: Diagnosis not present

## 2018-05-11 DIAGNOSIS — M170 Bilateral primary osteoarthritis of knee: Secondary | ICD-10-CM | POA: Diagnosis not present

## 2018-05-30 DIAGNOSIS — R569 Unspecified convulsions: Secondary | ICD-10-CM | POA: Diagnosis not present

## 2018-05-31 DIAGNOSIS — G894 Chronic pain syndrome: Secondary | ICD-10-CM | POA: Diagnosis not present

## 2018-05-31 DIAGNOSIS — Z79899 Other long term (current) drug therapy: Secondary | ICD-10-CM | POA: Diagnosis not present

## 2018-05-31 DIAGNOSIS — M170 Bilateral primary osteoarthritis of knee: Secondary | ICD-10-CM | POA: Diagnosis not present

## 2018-05-31 DIAGNOSIS — M797 Fibromyalgia: Secondary | ICD-10-CM | POA: Diagnosis not present

## 2018-05-31 DIAGNOSIS — Z79891 Long term (current) use of opiate analgesic: Secondary | ICD-10-CM | POA: Diagnosis not present

## 2018-05-31 DIAGNOSIS — M47812 Spondylosis without myelopathy or radiculopathy, cervical region: Secondary | ICD-10-CM | POA: Diagnosis not present

## 2018-06-02 ENCOUNTER — Other Ambulatory Visit: Payer: Self-pay | Admitting: Pain Medicine

## 2018-06-02 DIAGNOSIS — M545 Low back pain, unspecified: Secondary | ICD-10-CM

## 2018-06-02 DIAGNOSIS — M79673 Pain in unspecified foot: Secondary | ICD-10-CM | POA: Diagnosis not present

## 2018-06-02 DIAGNOSIS — Z79899 Other long term (current) drug therapy: Secondary | ICD-10-CM | POA: Diagnosis not present

## 2018-06-02 DIAGNOSIS — M0609 Rheumatoid arthritis without rheumatoid factor, multiple sites: Secondary | ICD-10-CM | POA: Diagnosis not present

## 2018-06-02 DIAGNOSIS — M797 Fibromyalgia: Secondary | ICD-10-CM | POA: Diagnosis not present

## 2018-06-02 DIAGNOSIS — R21 Rash and other nonspecific skin eruption: Secondary | ICD-10-CM | POA: Diagnosis not present

## 2018-06-02 DIAGNOSIS — J069 Acute upper respiratory infection, unspecified: Secondary | ICD-10-CM | POA: Diagnosis not present

## 2018-06-02 DIAGNOSIS — M79643 Pain in unspecified hand: Secondary | ICD-10-CM | POA: Diagnosis not present

## 2018-06-02 DIAGNOSIS — M1990 Unspecified osteoarthritis, unspecified site: Secondary | ICD-10-CM | POA: Diagnosis not present

## 2018-06-05 DIAGNOSIS — M47812 Spondylosis without myelopathy or radiculopathy, cervical region: Secondary | ICD-10-CM | POA: Diagnosis not present

## 2018-06-11 ENCOUNTER — Ambulatory Visit
Admission: RE | Admit: 2018-06-11 | Discharge: 2018-06-11 | Disposition: A | Discharge status: Home / Self Care | Payer: Medicare Other | Source: Ambulatory Visit | Attending: Pain Medicine | Admitting: Pain Medicine

## 2018-06-11 DIAGNOSIS — M545 Low back pain, unspecified: Secondary | ICD-10-CM

## 2018-06-28 DIAGNOSIS — M79673 Pain in unspecified foot: Secondary | ICD-10-CM | POA: Diagnosis not present

## 2018-06-28 DIAGNOSIS — M47812 Spondylosis without myelopathy or radiculopathy, cervical region: Secondary | ICD-10-CM | POA: Diagnosis not present

## 2018-06-28 DIAGNOSIS — M1990 Unspecified osteoarthritis, unspecified site: Secondary | ICD-10-CM | POA: Diagnosis not present

## 2018-06-28 DIAGNOSIS — G894 Chronic pain syndrome: Secondary | ICD-10-CM | POA: Diagnosis not present

## 2018-06-28 DIAGNOSIS — Z79899 Other long term (current) drug therapy: Secondary | ICD-10-CM | POA: Diagnosis not present

## 2018-06-28 DIAGNOSIS — M170 Bilateral primary osteoarthritis of knee: Secondary | ICD-10-CM | POA: Diagnosis not present

## 2018-06-28 DIAGNOSIS — M0609 Rheumatoid arthritis without rheumatoid factor, multiple sites: Secondary | ICD-10-CM | POA: Diagnosis not present

## 2018-06-28 DIAGNOSIS — R21 Rash and other nonspecific skin eruption: Secondary | ICD-10-CM | POA: Diagnosis not present

## 2018-06-28 DIAGNOSIS — M797 Fibromyalgia: Secondary | ICD-10-CM | POA: Diagnosis not present

## 2018-06-28 DIAGNOSIS — M79643 Pain in unspecified hand: Secondary | ICD-10-CM | POA: Diagnosis not present

## 2018-06-28 DIAGNOSIS — M542 Cervicalgia: Secondary | ICD-10-CM | POA: Diagnosis not present

## 2018-07-06 DIAGNOSIS — M069 Rheumatoid arthritis, unspecified: Secondary | ICD-10-CM | POA: Diagnosis not present

## 2018-07-06 DIAGNOSIS — R51 Headache: Secondary | ICD-10-CM | POA: Diagnosis not present

## 2018-07-06 DIAGNOSIS — J449 Chronic obstructive pulmonary disease, unspecified: Secondary | ICD-10-CM | POA: Diagnosis not present

## 2018-07-06 DIAGNOSIS — L930 Discoid lupus erythematosus: Secondary | ICD-10-CM | POA: Diagnosis not present

## 2018-07-06 DIAGNOSIS — I208 Other forms of angina pectoris: Secondary | ICD-10-CM | POA: Diagnosis not present

## 2018-07-06 DIAGNOSIS — I2510 Atherosclerotic heart disease of native coronary artery without angina pectoris: Secondary | ICD-10-CM | POA: Diagnosis not present

## 2018-07-06 DIAGNOSIS — R42 Dizziness and giddiness: Secondary | ICD-10-CM | POA: Diagnosis not present

## 2018-07-06 DIAGNOSIS — R0602 Shortness of breath: Secondary | ICD-10-CM | POA: Diagnosis not present

## 2018-07-06 DIAGNOSIS — Z955 Presence of coronary angioplasty implant and graft: Secondary | ICD-10-CM | POA: Diagnosis not present

## 2018-07-06 DIAGNOSIS — G4733 Obstructive sleep apnea (adult) (pediatric): Secondary | ICD-10-CM | POA: Diagnosis not present

## 2018-07-11 DIAGNOSIS — H5212 Myopia, left eye: Secondary | ICD-10-CM | POA: Diagnosis not present

## 2018-07-11 DIAGNOSIS — E119 Type 2 diabetes mellitus without complications: Secondary | ICD-10-CM | POA: Diagnosis not present

## 2018-07-11 DIAGNOSIS — H26053 Posterior subcapsular polar infantile and juvenile cataract, bilateral: Secondary | ICD-10-CM | POA: Diagnosis not present

## 2018-07-20 DIAGNOSIS — M7060 Trochanteric bursitis, unspecified hip: Secondary | ICD-10-CM | POA: Diagnosis not present

## 2018-07-26 DIAGNOSIS — M47812 Spondylosis without myelopathy or radiculopathy, cervical region: Secondary | ICD-10-CM | POA: Diagnosis not present

## 2018-08-02 DIAGNOSIS — M47817 Spondylosis without myelopathy or radiculopathy, lumbosacral region: Secondary | ICD-10-CM | POA: Diagnosis not present

## 2018-08-02 DIAGNOSIS — M069 Rheumatoid arthritis, unspecified: Secondary | ICD-10-CM | POA: Diagnosis not present

## 2018-08-02 DIAGNOSIS — M1710 Unilateral primary osteoarthritis, unspecified knee: Secondary | ICD-10-CM | POA: Diagnosis not present

## 2018-08-02 DIAGNOSIS — M47812 Spondylosis without myelopathy or radiculopathy, cervical region: Secondary | ICD-10-CM | POA: Diagnosis not present

## 2018-08-02 DIAGNOSIS — Z79891 Long term (current) use of opiate analgesic: Secondary | ICD-10-CM | POA: Diagnosis not present

## 2018-08-02 DIAGNOSIS — G894 Chronic pain syndrome: Secondary | ICD-10-CM | POA: Diagnosis not present

## 2018-08-02 DIAGNOSIS — Z79899 Other long term (current) drug therapy: Secondary | ICD-10-CM | POA: Diagnosis not present

## 2018-08-07 DIAGNOSIS — M79673 Pain in unspecified foot: Secondary | ICD-10-CM | POA: Diagnosis not present

## 2018-08-07 DIAGNOSIS — M0609 Rheumatoid arthritis without rheumatoid factor, multiple sites: Secondary | ICD-10-CM | POA: Diagnosis not present

## 2018-08-07 DIAGNOSIS — M1990 Unspecified osteoarthritis, unspecified site: Secondary | ICD-10-CM | POA: Diagnosis not present

## 2018-08-07 DIAGNOSIS — M797 Fibromyalgia: Secondary | ICD-10-CM | POA: Diagnosis not present

## 2018-08-07 DIAGNOSIS — R21 Rash and other nonspecific skin eruption: Secondary | ICD-10-CM | POA: Diagnosis not present

## 2018-08-07 DIAGNOSIS — Z79899 Other long term (current) drug therapy: Secondary | ICD-10-CM | POA: Diagnosis not present

## 2018-08-07 DIAGNOSIS — M79643 Pain in unspecified hand: Secondary | ICD-10-CM | POA: Diagnosis not present

## 2018-08-08 DIAGNOSIS — R69 Illness, unspecified: Secondary | ICD-10-CM | POA: Diagnosis not present

## 2018-08-16 DIAGNOSIS — R51 Headache: Secondary | ICD-10-CM | POA: Diagnosis not present

## 2018-08-16 DIAGNOSIS — G4733 Obstructive sleep apnea (adult) (pediatric): Secondary | ICD-10-CM | POA: Diagnosis not present

## 2018-08-16 DIAGNOSIS — D329 Benign neoplasm of meninges, unspecified: Secondary | ICD-10-CM | POA: Diagnosis not present

## 2018-08-16 DIAGNOSIS — E611 Iron deficiency: Secondary | ICD-10-CM | POA: Diagnosis not present

## 2018-08-16 DIAGNOSIS — B351 Tinea unguium: Secondary | ICD-10-CM | POA: Diagnosis not present

## 2018-08-16 DIAGNOSIS — E538 Deficiency of other specified B group vitamins: Secondary | ICD-10-CM | POA: Diagnosis not present

## 2018-08-16 DIAGNOSIS — R200 Anesthesia of skin: Secondary | ICD-10-CM | POA: Diagnosis not present

## 2018-08-16 DIAGNOSIS — R404 Transient alteration of awareness: Secondary | ICD-10-CM | POA: Diagnosis not present

## 2018-08-16 DIAGNOSIS — M170 Bilateral primary osteoarthritis of knee: Secondary | ICD-10-CM | POA: Diagnosis not present

## 2018-08-16 DIAGNOSIS — R251 Tremor, unspecified: Secondary | ICD-10-CM | POA: Diagnosis not present

## 2018-08-16 DIAGNOSIS — M47812 Spondylosis without myelopathy or radiculopathy, cervical region: Secondary | ICD-10-CM | POA: Diagnosis not present

## 2018-08-17 ENCOUNTER — Other Ambulatory Visit: Payer: Self-pay | Admitting: Neurology

## 2018-08-17 DIAGNOSIS — R05 Cough: Secondary | ICD-10-CM | POA: Diagnosis not present

## 2018-08-17 DIAGNOSIS — D329 Benign neoplasm of meninges, unspecified: Secondary | ICD-10-CM

## 2018-08-17 DIAGNOSIS — N898 Other specified noninflammatory disorders of vagina: Secondary | ICD-10-CM | POA: Diagnosis not present

## 2018-08-17 DIAGNOSIS — J209 Acute bronchitis, unspecified: Secondary | ICD-10-CM | POA: Diagnosis not present

## 2018-08-17 DIAGNOSIS — B372 Candidiasis of skin and nail: Secondary | ICD-10-CM | POA: Diagnosis not present

## 2018-08-22 DIAGNOSIS — S31109D Unspecified open wound of abdominal wall, unspecified quadrant without penetration into peritoneal cavity, subsequent encounter: Secondary | ICD-10-CM | POA: Diagnosis not present

## 2018-08-22 DIAGNOSIS — I2510 Atherosclerotic heart disease of native coronary artery without angina pectoris: Secondary | ICD-10-CM | POA: Diagnosis not present

## 2018-08-22 DIAGNOSIS — J449 Chronic obstructive pulmonary disease, unspecified: Secondary | ICD-10-CM | POA: Diagnosis not present

## 2018-08-22 DIAGNOSIS — I208 Other forms of angina pectoris: Secondary | ICD-10-CM | POA: Diagnosis not present

## 2018-08-22 DIAGNOSIS — L930 Discoid lupus erythematosus: Secondary | ICD-10-CM | POA: Diagnosis not present

## 2018-08-22 DIAGNOSIS — Z955 Presence of coronary angioplasty implant and graft: Secondary | ICD-10-CM | POA: Diagnosis not present

## 2018-08-22 DIAGNOSIS — R0602 Shortness of breath: Secondary | ICD-10-CM | POA: Diagnosis not present

## 2018-08-22 DIAGNOSIS — S31109A Unspecified open wound of abdominal wall, unspecified quadrant without penetration into peritoneal cavity, initial encounter: Secondary | ICD-10-CM | POA: Diagnosis not present

## 2018-08-22 DIAGNOSIS — M069 Rheumatoid arthritis, unspecified: Secondary | ICD-10-CM | POA: Diagnosis not present

## 2018-08-22 DIAGNOSIS — R42 Dizziness and giddiness: Secondary | ICD-10-CM | POA: Diagnosis not present

## 2018-08-22 DIAGNOSIS — G4733 Obstructive sleep apnea (adult) (pediatric): Secondary | ICD-10-CM | POA: Diagnosis not present

## 2018-08-22 DIAGNOSIS — R51 Headache: Secondary | ICD-10-CM | POA: Diagnosis not present

## 2018-08-25 ENCOUNTER — Ambulatory Visit
Admission: RE | Admit: 2018-08-25 | Discharge: 2018-08-25 | Disposition: A | Discharge status: Home / Self Care | Payer: Medicare HMO | Source: Ambulatory Visit | Attending: Neurology | Admitting: Neurology

## 2018-08-25 DIAGNOSIS — D320 Benign neoplasm of cerebral meninges: Secondary | ICD-10-CM | POA: Insufficient documentation

## 2018-08-25 DIAGNOSIS — D329 Benign neoplasm of meninges, unspecified: Secondary | ICD-10-CM

## 2018-08-25 LAB — POCT I-STAT CREATININE: Creatinine, Ser: 0.7 mg/dL (ref 0.44–1.00)

## 2018-08-25 MED ORDER — GADOBUTROL 1 MMOL/ML IV SOLN
10.0000 mL | Freq: Once | INTRAVENOUS | Status: AC | PRN
  Administered 2018-08-25: 10 mL via INTRAVENOUS

## 2018-08-28 DIAGNOSIS — R200 Anesthesia of skin: Secondary | ICD-10-CM | POA: Diagnosis not present

## 2018-08-30 DIAGNOSIS — M79674 Pain in right toe(s): Secondary | ICD-10-CM | POA: Diagnosis not present

## 2018-08-30 DIAGNOSIS — M545 Low back pain: Secondary | ICD-10-CM | POA: Diagnosis not present

## 2018-08-30 DIAGNOSIS — M069 Rheumatoid arthritis, unspecified: Secondary | ICD-10-CM | POA: Diagnosis not present

## 2018-08-30 DIAGNOSIS — B351 Tinea unguium: Secondary | ICD-10-CM | POA: Diagnosis not present

## 2018-08-30 DIAGNOSIS — G894 Chronic pain syndrome: Secondary | ICD-10-CM | POA: Diagnosis not present

## 2018-08-30 DIAGNOSIS — E1140 Type 2 diabetes mellitus with diabetic neuropathy, unspecified: Secondary | ICD-10-CM | POA: Diagnosis not present

## 2018-08-30 DIAGNOSIS — M722 Plantar fascial fibromatosis: Secondary | ICD-10-CM | POA: Diagnosis not present

## 2018-08-30 DIAGNOSIS — M47812 Spondylosis without myelopathy or radiculopathy, cervical region: Secondary | ICD-10-CM | POA: Diagnosis not present

## 2018-08-30 DIAGNOSIS — L600 Ingrowing nail: Secondary | ICD-10-CM | POA: Diagnosis not present

## 2018-08-30 DIAGNOSIS — M79675 Pain in left toe(s): Secondary | ICD-10-CM | POA: Diagnosis not present

## 2018-09-04 ENCOUNTER — Other Ambulatory Visit: Payer: Self-pay | Admitting: Neurosurgery

## 2018-09-04 DIAGNOSIS — D329 Benign neoplasm of meninges, unspecified: Secondary | ICD-10-CM

## 2018-09-08 DIAGNOSIS — J449 Chronic obstructive pulmonary disease, unspecified: Secondary | ICD-10-CM | POA: Diagnosis not present

## 2018-09-08 DIAGNOSIS — E118 Type 2 diabetes mellitus with unspecified complications: Secondary | ICD-10-CM | POA: Diagnosis not present

## 2018-09-08 DIAGNOSIS — J984 Other disorders of lung: Secondary | ICD-10-CM | POA: Diagnosis not present

## 2018-09-08 DIAGNOSIS — J069 Acute upper respiratory infection, unspecified: Secondary | ICD-10-CM | POA: Diagnosis not present

## 2018-09-18 DIAGNOSIS — Z9989 Dependence on other enabling machines and devices: Secondary | ICD-10-CM | POA: Diagnosis not present

## 2018-09-18 DIAGNOSIS — Z6841 Body Mass Index (BMI) 40.0 and over, adult: Secondary | ICD-10-CM | POA: Diagnosis not present

## 2018-09-18 DIAGNOSIS — I25119 Atherosclerotic heart disease of native coronary artery with unspecified angina pectoris: Secondary | ICD-10-CM | POA: Diagnosis not present

## 2018-09-18 DIAGNOSIS — M25562 Pain in left knee: Secondary | ICD-10-CM | POA: Diagnosis not present

## 2018-09-18 DIAGNOSIS — E118 Type 2 diabetes mellitus with unspecified complications: Secondary | ICD-10-CM | POA: Diagnosis not present

## 2018-09-18 DIAGNOSIS — J449 Chronic obstructive pulmonary disease, unspecified: Secondary | ICD-10-CM | POA: Diagnosis not present

## 2018-09-18 DIAGNOSIS — I700 Atherosclerosis of aorta: Secondary | ICD-10-CM | POA: Diagnosis not present

## 2018-09-18 DIAGNOSIS — G4739 Other sleep apnea: Secondary | ICD-10-CM | POA: Diagnosis not present

## 2018-09-19 DIAGNOSIS — R200 Anesthesia of skin: Secondary | ICD-10-CM | POA: Diagnosis not present

## 2018-09-19 DIAGNOSIS — R202 Paresthesia of skin: Secondary | ICD-10-CM | POA: Diagnosis not present

## 2018-09-20 DIAGNOSIS — R21 Rash and other nonspecific skin eruption: Secondary | ICD-10-CM | POA: Diagnosis not present

## 2018-09-20 DIAGNOSIS — M0609 Rheumatoid arthritis without rheumatoid factor, multiple sites: Secondary | ICD-10-CM | POA: Diagnosis not present

## 2018-09-20 DIAGNOSIS — M1990 Unspecified osteoarthritis, unspecified site: Secondary | ICD-10-CM | POA: Diagnosis not present

## 2018-09-20 DIAGNOSIS — M797 Fibromyalgia: Secondary | ICD-10-CM | POA: Diagnosis not present

## 2018-09-20 DIAGNOSIS — M79673 Pain in unspecified foot: Secondary | ICD-10-CM | POA: Diagnosis not present

## 2018-09-20 DIAGNOSIS — M79643 Pain in unspecified hand: Secondary | ICD-10-CM | POA: Diagnosis not present

## 2018-09-20 DIAGNOSIS — Z79899 Other long term (current) drug therapy: Secondary | ICD-10-CM | POA: Diagnosis not present

## 2018-09-20 DIAGNOSIS — G4733 Obstructive sleep apnea (adult) (pediatric): Secondary | ICD-10-CM | POA: Diagnosis not present

## 2018-09-20 DIAGNOSIS — G5600 Carpal tunnel syndrome, unspecified upper limb: Secondary | ICD-10-CM | POA: Diagnosis not present

## 2018-09-26 DIAGNOSIS — G8929 Other chronic pain: Secondary | ICD-10-CM | POA: Diagnosis not present

## 2018-09-26 DIAGNOSIS — M25561 Pain in right knee: Secondary | ICD-10-CM | POA: Diagnosis not present

## 2018-09-26 DIAGNOSIS — M170 Bilateral primary osteoarthritis of knee: Secondary | ICD-10-CM | POA: Diagnosis not present

## 2018-09-26 DIAGNOSIS — M25562 Pain in left knee: Secondary | ICD-10-CM | POA: Diagnosis not present

## 2018-09-27 DIAGNOSIS — M47812 Spondylosis without myelopathy or radiculopathy, cervical region: Secondary | ICD-10-CM | POA: Diagnosis not present

## 2018-09-27 DIAGNOSIS — G894 Chronic pain syndrome: Secondary | ICD-10-CM | POA: Diagnosis not present

## 2018-09-27 DIAGNOSIS — M47816 Spondylosis without myelopathy or radiculopathy, lumbar region: Secondary | ICD-10-CM | POA: Diagnosis not present

## 2018-09-27 DIAGNOSIS — M170 Bilateral primary osteoarthritis of knee: Secondary | ICD-10-CM | POA: Diagnosis not present

## 2018-10-03 DIAGNOSIS — E118 Type 2 diabetes mellitus with unspecified complications: Secondary | ICD-10-CM | POA: Diagnosis not present

## 2018-10-03 DIAGNOSIS — S31104A Unspecified open wound of abdominal wall, left lower quadrant without penetration into peritoneal cavity, initial encounter: Secondary | ICD-10-CM | POA: Diagnosis not present

## 2018-10-13 DIAGNOSIS — Z6841 Body Mass Index (BMI) 40.0 and over, adult: Secondary | ICD-10-CM | POA: Diagnosis not present

## 2018-10-13 DIAGNOSIS — B372 Candidiasis of skin and nail: Secondary | ICD-10-CM | POA: Diagnosis not present

## 2018-10-13 DIAGNOSIS — E118 Type 2 diabetes mellitus with unspecified complications: Secondary | ICD-10-CM | POA: Diagnosis not present

## 2018-10-13 DIAGNOSIS — Z713 Dietary counseling and surveillance: Secondary | ICD-10-CM | POA: Diagnosis not present

## 2018-10-13 DIAGNOSIS — L03116 Cellulitis of left lower limb: Secondary | ICD-10-CM | POA: Diagnosis not present

## 2018-10-16 DIAGNOSIS — E118 Type 2 diabetes mellitus with unspecified complications: Secondary | ICD-10-CM | POA: Diagnosis not present

## 2018-10-16 DIAGNOSIS — R05 Cough: Secondary | ICD-10-CM | POA: Diagnosis not present

## 2018-10-16 DIAGNOSIS — J9801 Acute bronchospasm: Secondary | ICD-10-CM | POA: Diagnosis not present

## 2018-10-19 DIAGNOSIS — G5603 Carpal tunnel syndrome, bilateral upper limbs: Secondary | ICD-10-CM | POA: Diagnosis not present

## 2018-10-26 DIAGNOSIS — Z79891 Long term (current) use of opiate analgesic: Secondary | ICD-10-CM | POA: Diagnosis not present

## 2018-10-26 DIAGNOSIS — M47812 Spondylosis without myelopathy or radiculopathy, cervical region: Secondary | ICD-10-CM | POA: Diagnosis not present

## 2018-10-26 DIAGNOSIS — M170 Bilateral primary osteoarthritis of knee: Secondary | ICD-10-CM | POA: Diagnosis not present

## 2018-10-26 DIAGNOSIS — Z79899 Other long term (current) drug therapy: Secondary | ICD-10-CM | POA: Diagnosis not present

## 2018-10-26 DIAGNOSIS — G894 Chronic pain syndrome: Secondary | ICD-10-CM | POA: Diagnosis not present

## 2018-10-26 DIAGNOSIS — M545 Low back pain: Secondary | ICD-10-CM | POA: Diagnosis not present

## 2018-10-26 DIAGNOSIS — M797 Fibromyalgia: Secondary | ICD-10-CM | POA: Diagnosis not present

## 2018-10-31 DIAGNOSIS — R6889 Other general symptoms and signs: Secondary | ICD-10-CM | POA: Diagnosis not present

## 2018-11-07 DIAGNOSIS — D329 Benign neoplasm of meninges, unspecified: Secondary | ICD-10-CM | POA: Diagnosis not present

## 2018-11-09 DIAGNOSIS — M170 Bilateral primary osteoarthritis of knee: Secondary | ICD-10-CM | POA: Diagnosis not present

## 2018-11-09 DIAGNOSIS — M069 Rheumatoid arthritis, unspecified: Secondary | ICD-10-CM | POA: Diagnosis not present

## 2018-11-14 DIAGNOSIS — E118 Type 2 diabetes mellitus with unspecified complications: Secondary | ICD-10-CM | POA: Diagnosis not present

## 2018-11-14 DIAGNOSIS — J40 Bronchitis, not specified as acute or chronic: Secondary | ICD-10-CM | POA: Diagnosis not present

## 2018-11-14 DIAGNOSIS — J9801 Acute bronchospasm: Secondary | ICD-10-CM | POA: Diagnosis not present

## 2018-11-23 DIAGNOSIS — G894 Chronic pain syndrome: Secondary | ICD-10-CM | POA: Diagnosis not present

## 2018-11-23 DIAGNOSIS — M170 Bilateral primary osteoarthritis of knee: Secondary | ICD-10-CM | POA: Diagnosis not present

## 2018-11-23 DIAGNOSIS — M069 Rheumatoid arthritis, unspecified: Secondary | ICD-10-CM | POA: Diagnosis not present

## 2018-11-23 DIAGNOSIS — M47812 Spondylosis without myelopathy or radiculopathy, cervical region: Secondary | ICD-10-CM | POA: Diagnosis not present

## 2018-11-24 ENCOUNTER — Encounter: Payer: Self-pay | Admitting: *Deleted

## 2018-11-24 ENCOUNTER — Encounter: Payer: Medicare HMO | Attending: Internal Medicine | Admitting: *Deleted

## 2018-11-24 ENCOUNTER — Telehealth: Payer: Self-pay | Admitting: *Deleted

## 2018-11-24 ENCOUNTER — Other Ambulatory Visit: Payer: Self-pay

## 2018-11-24 VITALS — BP 140/94 | Ht 63.0 in | Wt 322.3 lb

## 2018-11-24 DIAGNOSIS — E119 Type 2 diabetes mellitus without complications: Secondary | ICD-10-CM

## 2018-11-24 DIAGNOSIS — Z6841 Body Mass Index (BMI) 40.0 and over, adult: Secondary | ICD-10-CM | POA: Diagnosis not present

## 2018-11-24 DIAGNOSIS — E118 Type 2 diabetes mellitus with unspecified complications: Secondary | ICD-10-CM | POA: Diagnosis not present

## 2018-11-24 DIAGNOSIS — Z713 Dietary counseling and surveillance: Secondary | ICD-10-CM | POA: Insufficient documentation

## 2018-11-24 NOTE — Telephone Encounter (Signed)
Called patient to clarify next appointment on April 1. After visit summary had 9:30 am but we agreed on 10:30 am. She changed her copy of after visit summary. She reports that MD office called her back and Dr Ouida Sills is not starting any medications. She will have labs drawn on Mon and she has an appointment with him on Wed. Instructed her to be seen in Urgent Care or ED over the week-end if she continues to have high readings and increased symptoms.

## 2018-11-24 NOTE — Patient Instructions (Addendum)
Check blood sugars 2 x day before breakfast and 2 hrs after supper every day Bring blood sugar records to the next appointment  Call your doctor for a prescription for:  1. Meter strips (type) One Touch Verio checking  2 times per day  2. Lancets (type) One Touch Delica checking  2     times per day  Exercise: Walk as tolerated  Eat 3 meals day,  1-2  snacks a day Space meals 4-6 hours apart Avoid sugar sweetened drinks (soda, tea) Drinks plenty of water (8 glasses/day)  Complete 3 Day Food Record and bring to next appt  Quit smoking  Return for appointment on:  Wednesday December 13, 2018 at 10:30 am - Freda Munro (nurse)

## 2018-11-24 NOTE — Progress Notes (Signed)
Diabetes Self-Management Education  Visit Type: First/Initial  Appt. Start Time: 0945 Appt. End Time: 1130  11/24/2018  Ms. Rickey Barbara, identified by name and date of birth, is a 43 y.o. female with a diagnosis of Diabetes: Type 2.   ASSESSMENT  Blood pressure (!) 140/94, height 5\' 3"  (1.6 m), weight (!) 322 lb 4.8 oz (146.2 kg), last menstrual period 07/21/2017. Body mass index is 57.09 kg/m.  Diabetes Self-Management Education - 11/24/18 1326      Visit Information   Visit Type  First/Initial      Initial Visit   Diabetes Type  Type 2    Are you currently following a meal plan?  No    Are you taking your medications as prescribed?  Yes    Date Diagnosed  pt reports 2 months but A1C was 6.5 % in 2018      Health Coping   How would you rate your overall health?  Poor      Psychosocial Assessment   Patient Belief/Attitude about Diabetes  Other (comment)   "nervous"   Self-care barriers  None    Self-management support  Doctor's office;Family    Patient Concerns  Nutrition/Meal planning;Medication;Monitoring;Healthy Lifestyle;Problem Solving;Glycemic Control;Weight Control    Special Needs  None    Preferred Learning Style  Auditory;Hands on    Learning Readiness  Ready    How often do you need to have someone help you when you read instructions, pamphlets, or other written materials from your doctor or pharmacy?  1 - Never    What is the last grade level you completed in school?  12th      Pre-Education Assessment   Patient understands the diabetes disease and treatment process.  Needs Instruction    Patient understands incorporating nutritional management into lifestyle.  Needs Instruction    Patient undertands incorporating physical activity into lifestyle.  Needs Instruction    Patient understands using medications safely.  Needs Instruction    Patient understands monitoring blood glucose, interpreting and using results  Needs Instruction    Patient understands  prevention, detection, and treatment of acute complications.  Needs Instruction    Patient understands prevention, detection, and treatment of chronic complications.  Needs Instruction    Patient understands how to develop strategies to address psychosocial issues.  Needs Instruction    Patient understands how to develop strategies to promote health/change behavior.  Needs Instruction      Complications   Last HgB A1C per patient/outside source  8 %   10/03/18   How often do you check your blood sugar?  0 times/day (not testing)   Provided One Touch Verio Flex meter and instructed on use. BG upon return demonstration was 461 mg/dL at 11:00 am - 2 hrs pp. Repeat reading on office meter was 454 mg/dL. Called Dr Tonette Bihari office with BG results and need for prescription for diabetes meds and testing supplies; left message with Sharyn Lull 11:25 am   Have you had a dilated eye exam in the past 12 months?  Yes    Have you had a dental exam in the past 12 months?  No    Are you checking your feet?  No      Dietary Intake   Breakfast  2 packs of decreased sugar oatmeal; today she had sausage and egg McMuffin with 2 hashbrowns and soda    Lunch  cereal and milk; ham and cheese sandwich    Snack (afternoon)  sometimes trail mix  Dinner  beef stew, chicken, pork chops - rice pasta, potatoes, corn, green beans, carrots (reports family doesn't like vegetables)    Beverage(s)  water, daily soda, occasional sugar sweetened tea      Exercise   Exercise Type  ADL's      Patient Education   Previous Diabetes Education  No    Disease state   Definition of diabetes, type 1 and 2, and the diagnosis of diabetes;Factors that contribute to the development of diabetes    Nutrition management   Role of diet in the treatment of diabetes and the relationship between the three main macronutrients and blood glucose level;Food label reading, portion sizes and measuring food.;Reviewed blood glucose goals for pre and post  meals and how to evaluate the patients' food intake on their blood glucose level.    Physical activity and exercise   Role of exercise on diabetes management, blood pressure control and cardiac health.    Monitoring  Taught/evaluated SMBG meter.;Purpose and frequency of SMBG.;Taught/discussed recording of test results and interpretation of SMBG.;Identified appropriate SMBG and/or A1C goals.    Acute complications Discussed and identified patients' treatment of hyperglycemia. Instructed her to call MD office if she has not heard from them by 2 pm this afternoon or go to ED if blood sugars worsen over the week-end.    Chronic complications  Relationship between chronic complications and blood glucose control    Psychosocial adjustment  Identified and addressed patients feelings and concerns about diabetes    Personal strategies to promote health  Review risk of smoking and offered smoking cessation      Individualized Goals (developed by patient)   Reducing Risk  Improve blood sugars Decrease medications Prevent diabetes complications Lose weight Lead a healthier lifestyle Become more fit Quit smoking     Outcomes   Expected Outcomes  Other (comment)   Demonstrated interest in learning. Expect some positive outcomes. She reports she "will not stop drinking regular sodas completely"   Future DMSE  2 wks       Individualized Plan for Diabetes Self-Management Training:   Learning Objective:  Patient will have a greater understanding of diabetes self-management. Patient education plan is to attend individual and/or group sessions per assessed needs and concerns.   Plan:   Patient Instructions  Check blood sugars 2 x day before breakfast and 2 hrs after supper every day Bring blood sugar records to the next appointment Call your doctor for a prescription for:  1. Meter strips (type) One Touch Verio checking  2 times per day  2. Lancets (type) One Touch Delica checking  2     times per  day Exercise: Walk as tolerated Eat 3 meals day,  1-2  snacks a day Space meals 4-6 hours apart Avoid sugar sweetened drinks (soda, tea) Drinks plenty of water (8 glasses/day) Complete 3 Day Food Record and bring to next appt Quit smoking Return for appointment on:  Wednesday December 13, 2018 at 9:30 am - Freda Munro (nurse)   Expected Outcomes:  Demonstrated interest in learning. Expect some positive outcomes. She reports she "will not stop drinking regular sodas completely"  Education material provided:  General Meal Planning Guidelines Simple Meal Plan Meter = One Touch Verio Flex 3 Day Food Record Symptoms, causes and treatments of Hyperglycemia  If problems or questions, patient to contact team via:   Johny Drilling, RN, CCM, CDE [phone]  Future DSME appointment: 2 wks  She is not able to sit for long  periods as in our Diabetes classes. She agreed to attend the 2 Hour Refresher Program and her next visit is scheduled for December 13, 2018 with this nurse.

## 2018-11-27 DIAGNOSIS — E118 Type 2 diabetes mellitus with unspecified complications: Secondary | ICD-10-CM | POA: Diagnosis not present

## 2018-11-27 DIAGNOSIS — I25119 Atherosclerotic heart disease of native coronary artery with unspecified angina pectoris: Secondary | ICD-10-CM | POA: Diagnosis not present

## 2018-11-27 DIAGNOSIS — R69 Illness, unspecified: Secondary | ICD-10-CM | POA: Diagnosis not present

## 2018-11-27 DIAGNOSIS — M25562 Pain in left knee: Secondary | ICD-10-CM | POA: Diagnosis not present

## 2018-11-28 DIAGNOSIS — R69 Illness, unspecified: Secondary | ICD-10-CM | POA: Diagnosis not present

## 2018-11-29 DIAGNOSIS — R69 Illness, unspecified: Secondary | ICD-10-CM | POA: Diagnosis not present

## 2018-11-29 DIAGNOSIS — E118 Type 2 diabetes mellitus with unspecified complications: Secondary | ICD-10-CM | POA: Diagnosis not present

## 2018-12-03 IMAGING — CR DG KNEE COMPLETE 4+V*L*
1 series · 4 of 4 positions shown · non-contrast
Comparison: Concurrently obtained radiographs of the right knee

CLINICAL DATA: 42-year-old female with bilateral chronic knee pain

EXAM:
LEFT KNEE - COMPLETE 4+ VIEW

[Series 1: dg knee complete 4 views left · 0.14mm/px · 4 of 4 slices shown]
[im 1/4]
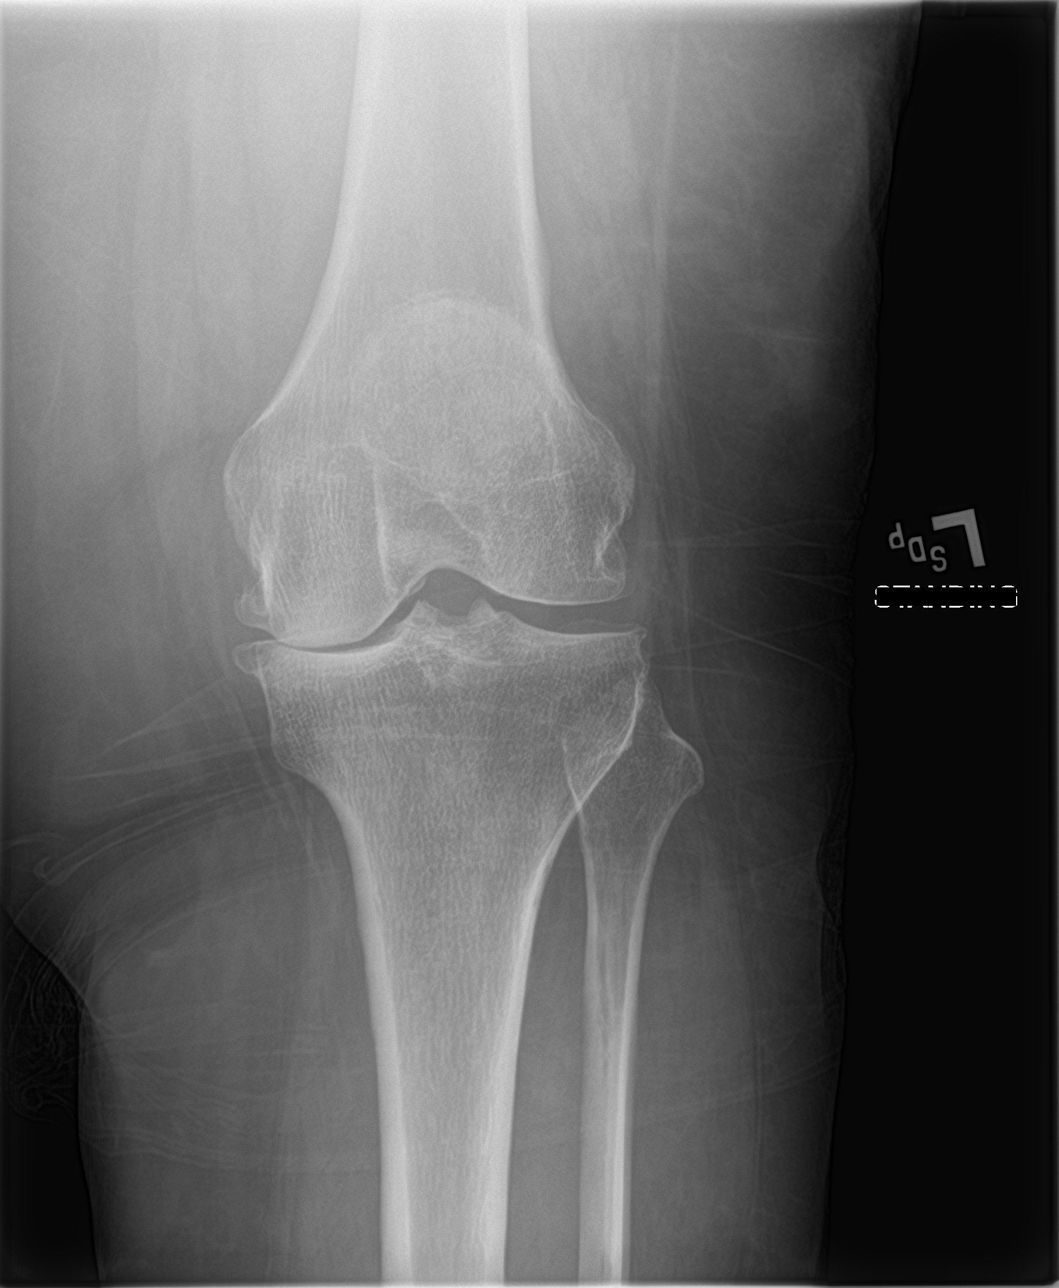
[im 2/4]
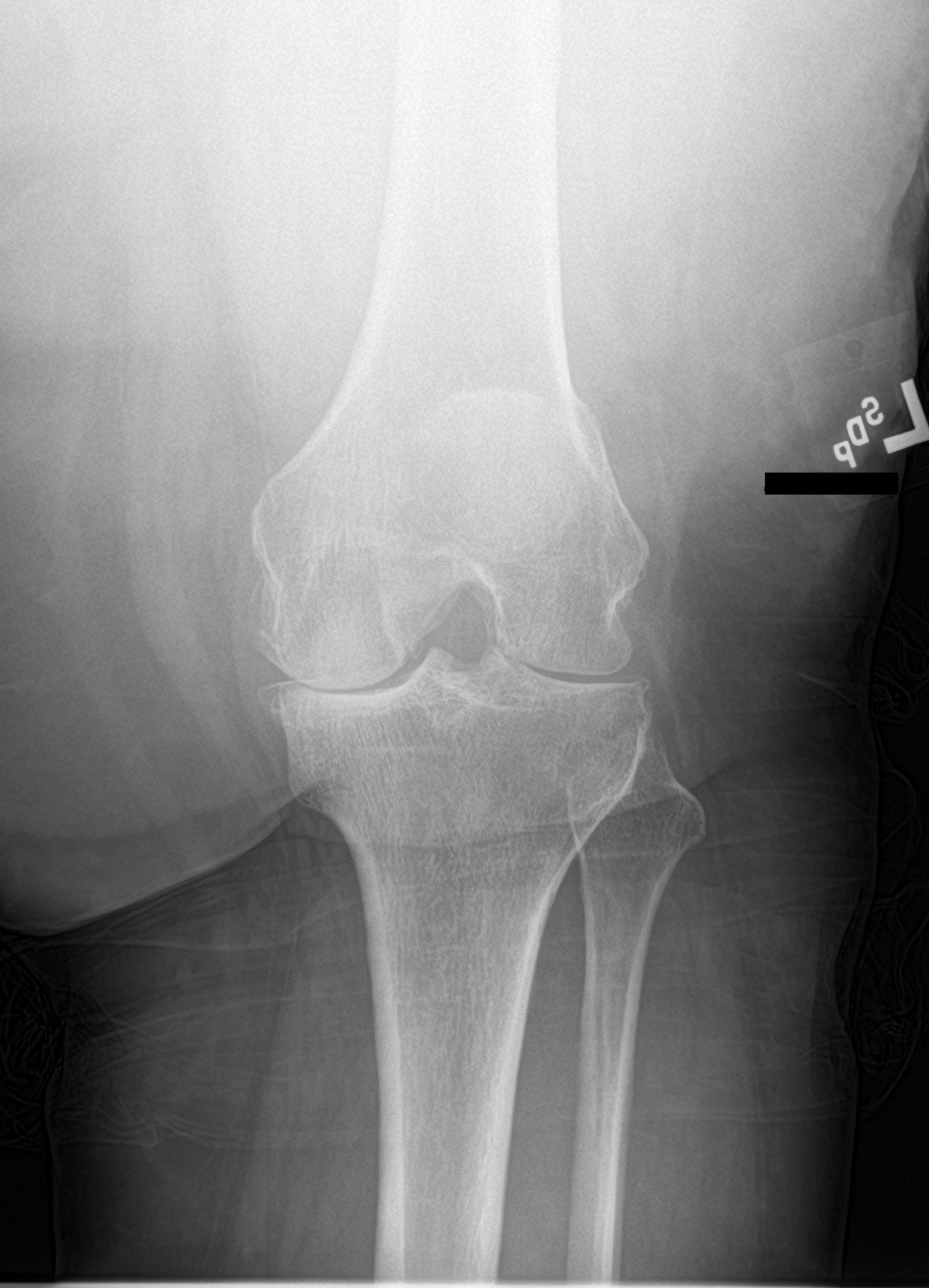
[im 3/4]
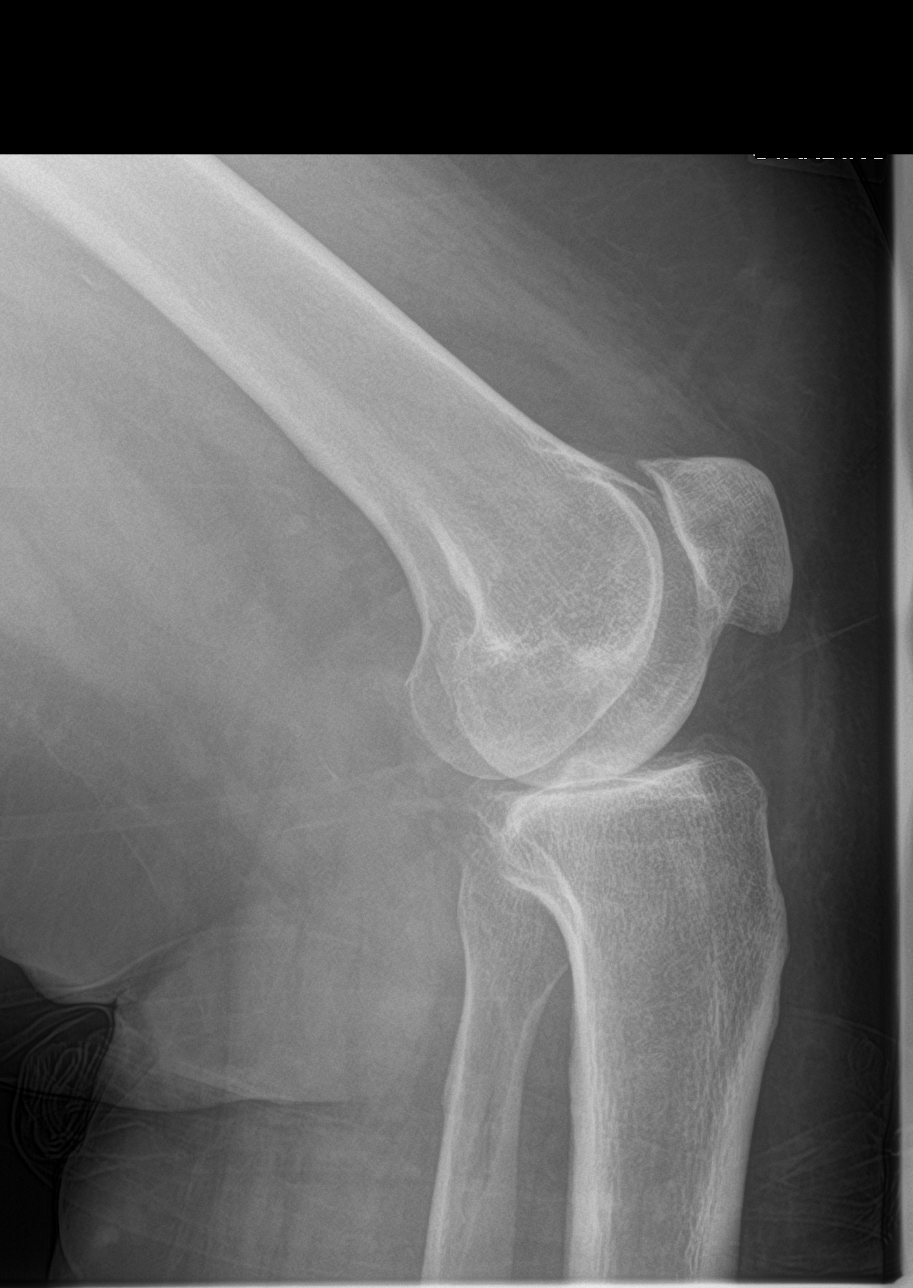
[im 4/4]
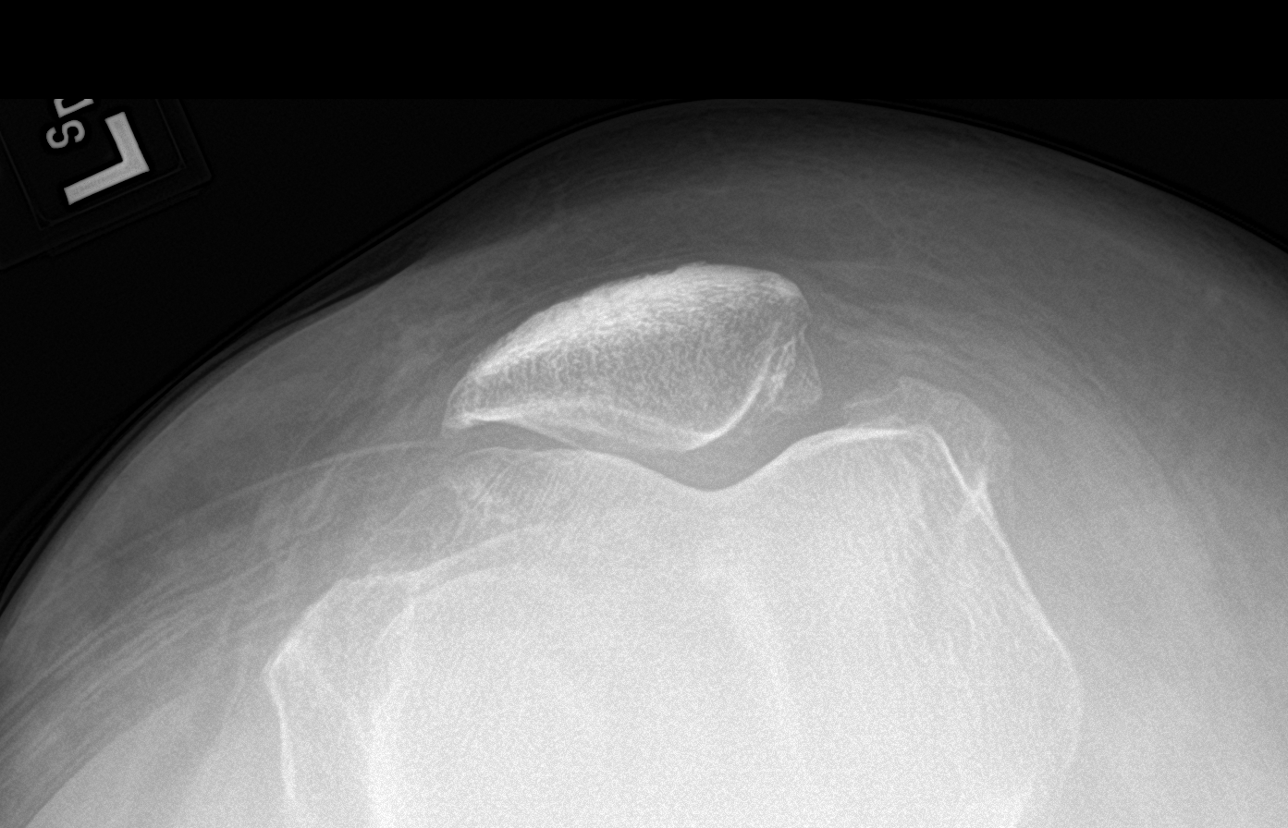

[4 of 4 positions shown; findings below may reference images not displayed]

FINDINGS: Tricompartmental degenerative osteoarthritis most severe in the
medial compartment were there is significant loss of joint space
height, subchondral sclerosis and osteophyte formation.
Patellofemoral compartment narrowing and osteophyte formation is
also present. No evidence of knee joint effusion. Normal bony
mineralization. No lytic or blastic osseous lesion.
IMPRESSION: Tricompartmental osteoarthritis most severe in the medial
compartment were there is significant joint space narrowing.

## 2018-12-06 DIAGNOSIS — M47812 Spondylosis without myelopathy or radiculopathy, cervical region: Secondary | ICD-10-CM | POA: Diagnosis not present

## 2018-12-13 ENCOUNTER — Other Ambulatory Visit: Payer: Self-pay | Admitting: Pain Medicine

## 2018-12-13 ENCOUNTER — Encounter: Payer: Medicare HMO | Attending: Internal Medicine | Admitting: *Deleted

## 2018-12-13 ENCOUNTER — Encounter: Payer: Self-pay | Admitting: *Deleted

## 2018-12-13 ENCOUNTER — Other Ambulatory Visit: Payer: Self-pay

## 2018-12-13 VITALS — BP 130/84 | Wt 319.5 lb

## 2018-12-13 DIAGNOSIS — E118 Type 2 diabetes mellitus with unspecified complications: Secondary | ICD-10-CM | POA: Diagnosis not present

## 2018-12-13 DIAGNOSIS — Z713 Dietary counseling and surveillance: Secondary | ICD-10-CM | POA: Insufficient documentation

## 2018-12-13 DIAGNOSIS — Z6841 Body Mass Index (BMI) 40.0 and over, adult: Secondary | ICD-10-CM | POA: Diagnosis not present

## 2018-12-13 DIAGNOSIS — M546 Pain in thoracic spine: Secondary | ICD-10-CM

## 2018-12-13 DIAGNOSIS — E119 Type 2 diabetes mellitus without complications: Secondary | ICD-10-CM

## 2018-12-13 NOTE — Patient Instructions (Signed)
Check blood sugars 2 x day before breakfast and 2 hrs after supper every day Bring blood sugar records to the next MD appointment  Exercise: walk as tolerated or do chair exercises  Eat 3 meals day,  1-2  snacks a day Space meals 4-6 hours apart Include 1 serving of protein with each meal Continue to limit sugar sweetened drinks (soda)

## 2018-12-13 NOTE — Progress Notes (Signed)
Diabetes Self-Management Education  Visit Type:  Follow-up  Appt. Start Time: 1050 Appt. End Time: 1210  12/13/2018  [name], identified by name and date of birth, is a 43 y.o. female with a diagnosis of Diabetes: Type 2.   ASSESSMENT  Blood pressure 130/84, weight (!) 319 lb 8 oz (144.9 kg), last menstrual period 07/21/2017. Body mass index is 56.6 kg/m.   Diabetes Self-Management Education - 62/95/28 4132      Complications   Last HgB A1C per patient/outside source  11.1 %   11/27/18   How often do you check your blood sugar?  1-2 times/day    Fasting Blood glucose range (mg/dL)  70-129;130-179   in the last week 103-150 mg/dL.    Postprandial Blood glucose range (mg/dL)  70-129;130-179;180-200;>200   in  the last week 98-228 mg/dL.    Have you had a dilated eye exam in the past 12 months?  Yes    Have you had a dental exam in the past 12 months?  No    Are you checking your feet?  No      Dietary Intake   Breakfast  eating 3 meals and 0-1 snacks      Exercise   Exercise Type  ADL's      Patient Education   Disease state   Definition of diabetes, type 1 and 2, and the diagnosis of diabetes;Factors that contribute to the development of diabetes    Nutrition management   Role of diet in the treatment of diabetes and the relationship between the three main macronutrients and blood glucose level;Carbohydrate counting;Food label reading, portion sizes and measuring food.;Reviewed blood glucose goals for pre and post meals and how to evaluate the patients' food intake on their blood glucose level.    Physical activity and exercise   Role of exercise on diabetes management, blood pressure control and cardiac health.    Medications  Reviewed patients medication for diabetes, action, purpose, timing of dose and side effects.    Monitoring  Purpose and frequency of SMBG.;Taught/discussed recording of test results and interpretation of SMBG.;Identified appropriate SMBG and/or  A1C goals.;Interpreting lab values - A1C, lipid, urine microalbumina.    Acute complications  Discussed and identified patients' treatment of hyperglycemia.    Chronic complications  Retinopathy and reason for yearly dilated eye exams;Relationship between chronic complications and blood glucose control;Nephropathy, what it is, prevention of, the use of ACE, ARB's and early detection of through urine microalbumia.;Assessed and discussed foot care and prevention of foot problems    Psychosocial adjustment  Role of stress on diabetes;Identified and addressed patients feelings and concerns about diabetes    Personal strategies to promote health  Review risk of smoking and offered smoking cessation      Individualized Goals (developed by patient)   Nutrition  Follow meal plan discussed    Physical Activity  --   Walk as tolerated; begin chair exercises    Medications  take my medication as prescribed    Monitoring   test my blood glucose as discussed    Reducing Risk  examine blood glucose patterns;stop smoking;do foot checks daily      Post-Education Assessment   Patient understands the diabetes disease and treatment process.  Needs Review    Patient understands incorporating nutritional management into lifestyle.  Needs Review    Patient undertands incorporating physical activity into lifestyle.  Needs Instruction    Patient understands using medications safely.  Demonstrates understanding / competency  Patient understands monitoring blood glucose, interpreting and using results  Demonstrates understanding / competency    Patient understands prevention, detection, and treatment of acute complications.  Needs Review    Patient understands prevention, detection, and treatment of chronic complications.  Needs Review    Patient understands how to develop strategies to address psychosocial issues.  Needs Review    Patient understands how to develop strategies to promote health/change behavior.   Needs Review      Outcomes   Program Status  Completed      Subsequent Visit   Since your last visit have you continued or begun to take your medications as prescribed?  Yes   Pt called MD office due to rash from Glimepiride (she has sulfa allergy).  She will be started on Jardiance.    Since your last visit have you had your blood pressure checked?  Yes    Is your most recent blood pressure lower, unchanged, or higher since your last visit?  Lower    Since your last visit have you experienced any weight changes?  Loss    Weight Loss (lbs)  3    Since your last visit, are you checking your blood glucose at least once a day?  Yes       Learning Objective:  Patient will have a greater understanding of diabetes self-management. Patient education plan is to attend individual and/or group sessions per assessed needs and concerns.   Plan:   Patient Instructions  Check blood sugars 2 x day before breakfast and 2 hrs after supper every day Bring blood sugar records to the next MD appointment Exercise: walk as tolerated or do chair exercises Eat 3 meals day,  1-2  snacks a day Space meals 4-6 hours apart Include 1 serving of protein with each meal Continue to limit sugar sweetened drinks (soda)  Expected Outcomes:  Demonstrated interest in learning. Expect positive outcomes  Education material provided:  Planning a Balanced Meal Quick and Healthy meals Exercises (ADA) Diabetes: You Take Control Guide (ADA)  If problems or questions, patient to contact team via:   Johny Drilling, RN, [place], CDE [phone]  Future DSME appointment: - PRN  Instructed her to call for any questions. She has completed the 2 Hour Refresher Program.

## 2018-12-18 DIAGNOSIS — E118 Type 2 diabetes mellitus with unspecified complications: Secondary | ICD-10-CM | POA: Diagnosis not present

## 2018-12-18 DIAGNOSIS — L282 Other prurigo: Secondary | ICD-10-CM | POA: Diagnosis not present

## 2018-12-21 DIAGNOSIS — Z79899 Other long term (current) drug therapy: Secondary | ICD-10-CM | POA: Diagnosis not present

## 2018-12-21 DIAGNOSIS — M797 Fibromyalgia: Secondary | ICD-10-CM | POA: Diagnosis not present

## 2018-12-21 DIAGNOSIS — G894 Chronic pain syndrome: Secondary | ICD-10-CM | POA: Diagnosis not present

## 2018-12-21 DIAGNOSIS — M069 Rheumatoid arthritis, unspecified: Secondary | ICD-10-CM | POA: Diagnosis not present

## 2018-12-21 DIAGNOSIS — M47812 Spondylosis without myelopathy or radiculopathy, cervical region: Secondary | ICD-10-CM | POA: Diagnosis not present

## 2018-12-21 DIAGNOSIS — Z79891 Long term (current) use of opiate analgesic: Secondary | ICD-10-CM | POA: Diagnosis not present

## 2018-12-26 DIAGNOSIS — M1990 Unspecified osteoarthritis, unspecified site: Secondary | ICD-10-CM | POA: Diagnosis not present

## 2018-12-26 DIAGNOSIS — M797 Fibromyalgia: Secondary | ICD-10-CM | POA: Diagnosis not present

## 2018-12-26 DIAGNOSIS — Z79899 Other long term (current) drug therapy: Secondary | ICD-10-CM | POA: Diagnosis not present

## 2018-12-26 DIAGNOSIS — R21 Rash and other nonspecific skin eruption: Secondary | ICD-10-CM | POA: Diagnosis not present

## 2018-12-26 DIAGNOSIS — G5600 Carpal tunnel syndrome, unspecified upper limb: Secondary | ICD-10-CM | POA: Diagnosis not present

## 2018-12-26 DIAGNOSIS — M0609 Rheumatoid arthritis without rheumatoid factor, multiple sites: Secondary | ICD-10-CM | POA: Diagnosis not present

## 2018-12-26 DIAGNOSIS — M79673 Pain in unspecified foot: Secondary | ICD-10-CM | POA: Diagnosis not present

## 2018-12-26 DIAGNOSIS — M79643 Pain in unspecified hand: Secondary | ICD-10-CM | POA: Diagnosis not present

## 2018-12-28 DIAGNOSIS — Z79899 Other long term (current) drug therapy: Secondary | ICD-10-CM | POA: Diagnosis not present

## 2018-12-28 DIAGNOSIS — M0609 Rheumatoid arthritis without rheumatoid factor, multiple sites: Secondary | ICD-10-CM | POA: Diagnosis not present

## 2019-01-09 DIAGNOSIS — M329 Systemic lupus erythematosus, unspecified: Secondary | ICD-10-CM | POA: Diagnosis not present

## 2019-01-09 DIAGNOSIS — M0609 Rheumatoid arthritis without rheumatoid factor, multiple sites: Secondary | ICD-10-CM | POA: Diagnosis not present

## 2019-01-09 DIAGNOSIS — R21 Rash and other nonspecific skin eruption: Secondary | ICD-10-CM | POA: Diagnosis not present

## 2019-01-09 DIAGNOSIS — R05 Cough: Secondary | ICD-10-CM | POA: Diagnosis not present

## 2019-01-11 DIAGNOSIS — M329 Systemic lupus erythematosus, unspecified: Secondary | ICD-10-CM | POA: Diagnosis not present

## 2019-01-11 DIAGNOSIS — E118 Type 2 diabetes mellitus with unspecified complications: Secondary | ICD-10-CM | POA: Diagnosis not present

## 2019-01-11 DIAGNOSIS — J449 Chronic obstructive pulmonary disease, unspecified: Secondary | ICD-10-CM | POA: Diagnosis not present

## 2019-01-11 DIAGNOSIS — M0609 Rheumatoid arthritis without rheumatoid factor, multiple sites: Secondary | ICD-10-CM | POA: Diagnosis not present

## 2019-01-16 DIAGNOSIS — R69 Illness, unspecified: Secondary | ICD-10-CM | POA: Diagnosis not present

## 2019-01-17 DIAGNOSIS — M069 Rheumatoid arthritis, unspecified: Secondary | ICD-10-CM | POA: Diagnosis not present

## 2019-01-17 DIAGNOSIS — G894 Chronic pain syndrome: Secondary | ICD-10-CM | POA: Diagnosis not present

## 2019-01-17 DIAGNOSIS — M47812 Spondylosis without myelopathy or radiculopathy, cervical region: Secondary | ICD-10-CM | POA: Diagnosis not present

## 2019-01-17 DIAGNOSIS — M170 Bilateral primary osteoarthritis of knee: Secondary | ICD-10-CM | POA: Diagnosis not present

## 2019-01-18 DIAGNOSIS — R69 Illness, unspecified: Secondary | ICD-10-CM | POA: Diagnosis not present

## 2019-01-18 DIAGNOSIS — R05 Cough: Secondary | ICD-10-CM | POA: Diagnosis not present

## 2019-01-22 DIAGNOSIS — M79674 Pain in right toe(s): Secondary | ICD-10-CM | POA: Diagnosis not present

## 2019-01-22 DIAGNOSIS — M79675 Pain in left toe(s): Secondary | ICD-10-CM | POA: Diagnosis not present

## 2019-01-22 DIAGNOSIS — B351 Tinea unguium: Secondary | ICD-10-CM | POA: Diagnosis not present

## 2019-02-09 ENCOUNTER — Ambulatory Visit
Admission: RE | Admit: 2019-02-09 | Discharge: 2019-02-09 | Disposition: A | Discharge status: Home / Self Care | Payer: Medicare HMO | Source: Ambulatory Visit | Attending: Pain Medicine | Admitting: Pain Medicine

## 2019-02-09 ENCOUNTER — Other Ambulatory Visit: Payer: Self-pay

## 2019-02-09 DIAGNOSIS — M47815 Spondylosis without myelopathy or radiculopathy, thoracolumbar region: Secondary | ICD-10-CM | POA: Diagnosis not present

## 2019-02-09 DIAGNOSIS — M546 Pain in thoracic spine: Secondary | ICD-10-CM

## 2019-02-09 DIAGNOSIS — M47814 Spondylosis without myelopathy or radiculopathy, thoracic region: Secondary | ICD-10-CM | POA: Diagnosis not present

## 2019-02-14 DIAGNOSIS — M797 Fibromyalgia: Secondary | ICD-10-CM | POA: Diagnosis not present

## 2019-02-14 DIAGNOSIS — M545 Low back pain: Secondary | ICD-10-CM | POA: Diagnosis not present

## 2019-02-14 DIAGNOSIS — M47812 Spondylosis without myelopathy or radiculopathy, cervical region: Secondary | ICD-10-CM | POA: Diagnosis not present

## 2019-02-14 DIAGNOSIS — G894 Chronic pain syndrome: Secondary | ICD-10-CM | POA: Diagnosis not present

## 2019-02-28 DIAGNOSIS — M47814 Spondylosis without myelopathy or radiculopathy, thoracic region: Secondary | ICD-10-CM | POA: Diagnosis not present

## 2019-02-28 DIAGNOSIS — M5134 Other intervertebral disc degeneration, thoracic region: Secondary | ICD-10-CM | POA: Diagnosis not present

## 2019-03-07 DIAGNOSIS — G4733 Obstructive sleep apnea (adult) (pediatric): Secondary | ICD-10-CM | POA: Diagnosis not present

## 2019-03-12 DIAGNOSIS — E118 Type 2 diabetes mellitus with unspecified complications: Secondary | ICD-10-CM | POA: Diagnosis not present

## 2019-03-12 DIAGNOSIS — J449 Chronic obstructive pulmonary disease, unspecified: Secondary | ICD-10-CM | POA: Diagnosis not present

## 2019-03-12 DIAGNOSIS — M0609 Rheumatoid arthritis without rheumatoid factor, multiple sites: Secondary | ICD-10-CM | POA: Diagnosis not present

## 2019-03-12 DIAGNOSIS — L282 Other prurigo: Secondary | ICD-10-CM | POA: Diagnosis not present

## 2019-03-12 DIAGNOSIS — M329 Systemic lupus erythematosus, unspecified: Secondary | ICD-10-CM | POA: Diagnosis not present

## 2019-03-19 DIAGNOSIS — R21 Rash and other nonspecific skin eruption: Secondary | ICD-10-CM | POA: Diagnosis not present

## 2019-03-19 DIAGNOSIS — M79673 Pain in unspecified foot: Secondary | ICD-10-CM | POA: Diagnosis not present

## 2019-03-19 DIAGNOSIS — M0609 Rheumatoid arthritis without rheumatoid factor, multiple sites: Secondary | ICD-10-CM | POA: Diagnosis not present

## 2019-03-19 DIAGNOSIS — G894 Chronic pain syndrome: Secondary | ICD-10-CM | POA: Diagnosis not present

## 2019-03-19 DIAGNOSIS — M170 Bilateral primary osteoarthritis of knee: Secondary | ICD-10-CM | POA: Diagnosis not present

## 2019-03-19 DIAGNOSIS — G5600 Carpal tunnel syndrome, unspecified upper limb: Secondary | ICD-10-CM | POA: Diagnosis not present

## 2019-03-19 DIAGNOSIS — M79643 Pain in unspecified hand: Secondary | ICD-10-CM | POA: Diagnosis not present

## 2019-03-19 DIAGNOSIS — M5136 Other intervertebral disc degeneration, lumbar region: Secondary | ICD-10-CM | POA: Diagnosis not present

## 2019-03-19 DIAGNOSIS — M1990 Unspecified osteoarthritis, unspecified site: Secondary | ICD-10-CM | POA: Diagnosis not present

## 2019-03-19 DIAGNOSIS — M5134 Other intervertebral disc degeneration, thoracic region: Secondary | ICD-10-CM | POA: Diagnosis not present

## 2019-03-19 DIAGNOSIS — Z79891 Long term (current) use of opiate analgesic: Secondary | ICD-10-CM | POA: Diagnosis not present

## 2019-03-19 DIAGNOSIS — M797 Fibromyalgia: Secondary | ICD-10-CM | POA: Diagnosis not present

## 2019-03-19 DIAGNOSIS — Z79899 Other long term (current) drug therapy: Secondary | ICD-10-CM | POA: Diagnosis not present

## 2019-03-19 DIAGNOSIS — M25561 Pain in right knee: Secondary | ICD-10-CM | POA: Diagnosis not present

## 2019-03-20 DIAGNOSIS — E118 Type 2 diabetes mellitus with unspecified complications: Secondary | ICD-10-CM | POA: Diagnosis not present

## 2019-03-20 DIAGNOSIS — Z6841 Body Mass Index (BMI) 40.0 and over, adult: Secondary | ICD-10-CM | POA: Diagnosis not present

## 2019-03-20 DIAGNOSIS — I700 Atherosclerosis of aorta: Secondary | ICD-10-CM | POA: Diagnosis not present

## 2019-03-20 DIAGNOSIS — I25119 Atherosclerotic heart disease of native coronary artery with unspecified angina pectoris: Secondary | ICD-10-CM | POA: Diagnosis not present

## 2019-03-20 DIAGNOSIS — J449 Chronic obstructive pulmonary disease, unspecified: Secondary | ICD-10-CM | POA: Diagnosis not present

## 2019-03-20 DIAGNOSIS — R569 Unspecified convulsions: Secondary | ICD-10-CM | POA: Diagnosis not present

## 2019-03-27 DIAGNOSIS — L304 Erythema intertrigo: Secondary | ICD-10-CM | POA: Diagnosis not present

## 2019-03-27 DIAGNOSIS — D2362 Other benign neoplasm of skin of left upper limb, including shoulder: Secondary | ICD-10-CM | POA: Diagnosis not present

## 2019-03-27 DIAGNOSIS — D2361 Other benign neoplasm of skin of right upper limb, including shoulder: Secondary | ICD-10-CM | POA: Diagnosis not present

## 2019-03-27 DIAGNOSIS — L309 Dermatitis, unspecified: Secondary | ICD-10-CM | POA: Diagnosis not present

## 2019-03-28 DIAGNOSIS — M47817 Spondylosis without myelopathy or radiculopathy, lumbosacral region: Secondary | ICD-10-CM | POA: Diagnosis not present

## 2019-03-28 DIAGNOSIS — M5136 Other intervertebral disc degeneration, lumbar region: Secondary | ICD-10-CM | POA: Diagnosis not present

## 2019-03-28 DIAGNOSIS — M545 Low back pain: Secondary | ICD-10-CM | POA: Diagnosis not present

## 2019-03-28 DIAGNOSIS — G894 Chronic pain syndrome: Secondary | ICD-10-CM | POA: Diagnosis not present

## 2019-03-29 DIAGNOSIS — M1712 Unilateral primary osteoarthritis, left knee: Secondary | ICD-10-CM | POA: Diagnosis not present

## 2019-03-29 DIAGNOSIS — G8929 Other chronic pain: Secondary | ICD-10-CM | POA: Diagnosis not present

## 2019-03-29 DIAGNOSIS — M25562 Pain in left knee: Secondary | ICD-10-CM | POA: Diagnosis not present

## 2019-03-29 DIAGNOSIS — M1711 Unilateral primary osteoarthritis, right knee: Secondary | ICD-10-CM | POA: Diagnosis not present

## 2019-03-29 DIAGNOSIS — M25561 Pain in right knee: Secondary | ICD-10-CM | POA: Diagnosis not present

## 2019-04-05 DIAGNOSIS — M170 Bilateral primary osteoarthritis of knee: Secondary | ICD-10-CM | POA: Diagnosis not present

## 2019-04-05 DIAGNOSIS — M069 Rheumatoid arthritis, unspecified: Secondary | ICD-10-CM | POA: Diagnosis not present

## 2019-04-06 DIAGNOSIS — R69 Illness, unspecified: Secondary | ICD-10-CM | POA: Diagnosis not present

## 2019-04-18 DIAGNOSIS — M5489 Other dorsalgia: Secondary | ICD-10-CM | POA: Diagnosis not present

## 2019-04-18 DIAGNOSIS — M7060 Trochanteric bursitis, unspecified hip: Secondary | ICD-10-CM | POA: Diagnosis not present

## 2019-04-18 DIAGNOSIS — M5134 Other intervertebral disc degeneration, thoracic region: Secondary | ICD-10-CM | POA: Diagnosis not present

## 2019-04-18 DIAGNOSIS — G894 Chronic pain syndrome: Secondary | ICD-10-CM | POA: Diagnosis not present

## 2019-04-19 DIAGNOSIS — R05 Cough: Secondary | ICD-10-CM | POA: Diagnosis not present

## 2019-04-19 DIAGNOSIS — J449 Chronic obstructive pulmonary disease, unspecified: Secondary | ICD-10-CM | POA: Diagnosis not present

## 2019-04-23 DIAGNOSIS — E611 Iron deficiency: Secondary | ICD-10-CM | POA: Diagnosis not present

## 2019-04-23 DIAGNOSIS — E538 Deficiency of other specified B group vitamins: Secondary | ICD-10-CM | POA: Diagnosis not present

## 2019-04-23 DIAGNOSIS — D329 Benign neoplasm of meninges, unspecified: Secondary | ICD-10-CM | POA: Diagnosis not present

## 2019-04-23 DIAGNOSIS — R69 Illness, unspecified: Secondary | ICD-10-CM | POA: Diagnosis not present

## 2019-04-23 DIAGNOSIS — R200 Anesthesia of skin: Secondary | ICD-10-CM | POA: Diagnosis not present

## 2019-04-23 DIAGNOSIS — G4733 Obstructive sleep apnea (adult) (pediatric): Secondary | ICD-10-CM | POA: Diagnosis not present

## 2019-04-27 DIAGNOSIS — L304 Erythema intertrigo: Secondary | ICD-10-CM | POA: Diagnosis not present

## 2019-05-02 DIAGNOSIS — M5489 Other dorsalgia: Secondary | ICD-10-CM | POA: Diagnosis not present

## 2019-05-02 DIAGNOSIS — M47814 Spondylosis without myelopathy or radiculopathy, thoracic region: Secondary | ICD-10-CM | POA: Diagnosis not present

## 2019-05-02 DIAGNOSIS — M5134 Other intervertebral disc degeneration, thoracic region: Secondary | ICD-10-CM | POA: Diagnosis not present

## 2019-05-03 DIAGNOSIS — R21 Rash and other nonspecific skin eruption: Secondary | ICD-10-CM | POA: Diagnosis not present

## 2019-05-03 DIAGNOSIS — M0609 Rheumatoid arthritis without rheumatoid factor, multiple sites: Secondary | ICD-10-CM | POA: Diagnosis not present

## 2019-05-03 DIAGNOSIS — M1990 Unspecified osteoarthritis, unspecified site: Secondary | ICD-10-CM | POA: Diagnosis not present

## 2019-05-03 DIAGNOSIS — G5600 Carpal tunnel syndrome, unspecified upper limb: Secondary | ICD-10-CM | POA: Diagnosis not present

## 2019-05-03 DIAGNOSIS — M79673 Pain in unspecified foot: Secondary | ICD-10-CM | POA: Diagnosis not present

## 2019-05-03 DIAGNOSIS — M25561 Pain in right knee: Secondary | ICD-10-CM | POA: Diagnosis not present

## 2019-05-03 DIAGNOSIS — M797 Fibromyalgia: Secondary | ICD-10-CM | POA: Diagnosis not present

## 2019-05-03 DIAGNOSIS — M79643 Pain in unspecified hand: Secondary | ICD-10-CM | POA: Diagnosis not present

## 2019-05-03 DIAGNOSIS — Z79899 Other long term (current) drug therapy: Secondary | ICD-10-CM | POA: Diagnosis not present

## 2019-05-16 DIAGNOSIS — E119 Type 2 diabetes mellitus without complications: Secondary | ICD-10-CM | POA: Diagnosis not present

## 2019-05-16 DIAGNOSIS — H5213 Myopia, bilateral: Secondary | ICD-10-CM | POA: Diagnosis not present

## 2019-05-24 DIAGNOSIS — M797 Fibromyalgia: Secondary | ICD-10-CM | POA: Diagnosis not present

## 2019-05-24 DIAGNOSIS — M170 Bilateral primary osteoarthritis of knee: Secondary | ICD-10-CM | POA: Diagnosis not present

## 2019-05-24 DIAGNOSIS — M545 Low back pain: Secondary | ICD-10-CM | POA: Diagnosis not present

## 2019-05-24 DIAGNOSIS — G894 Chronic pain syndrome: Secondary | ICD-10-CM | POA: Diagnosis not present

## 2019-05-24 DIAGNOSIS — M47812 Spondylosis without myelopathy or radiculopathy, cervical region: Secondary | ICD-10-CM | POA: Diagnosis not present

## 2019-06-01 DIAGNOSIS — G5603 Carpal tunnel syndrome, bilateral upper limbs: Secondary | ICD-10-CM | POA: Diagnosis not present

## 2019-06-07 DIAGNOSIS — G5603 Carpal tunnel syndrome, bilateral upper limbs: Secondary | ICD-10-CM | POA: Diagnosis not present

## 2019-06-11 DIAGNOSIS — M47817 Spondylosis without myelopathy or radiculopathy, lumbosacral region: Secondary | ICD-10-CM | POA: Diagnosis not present

## 2019-06-11 DIAGNOSIS — G4733 Obstructive sleep apnea (adult) (pediatric): Secondary | ICD-10-CM | POA: Diagnosis not present

## 2019-06-11 DIAGNOSIS — Z79899 Other long term (current) drug therapy: Secondary | ICD-10-CM | POA: Diagnosis not present

## 2019-06-11 DIAGNOSIS — M0609 Rheumatoid arthritis without rheumatoid factor, multiple sites: Secondary | ICD-10-CM | POA: Diagnosis not present

## 2019-06-13 ENCOUNTER — Other Ambulatory Visit: Payer: Self-pay | Admitting: Pain Medicine

## 2019-06-13 DIAGNOSIS — M545 Low back pain, unspecified: Secondary | ICD-10-CM

## 2019-06-19 DIAGNOSIS — M79609 Pain in unspecified limb: Secondary | ICD-10-CM | POA: Diagnosis not present

## 2019-06-19 DIAGNOSIS — M5416 Radiculopathy, lumbar region: Secondary | ICD-10-CM | POA: Diagnosis not present

## 2019-06-21 DIAGNOSIS — Z79891 Long term (current) use of opiate analgesic: Secondary | ICD-10-CM | POA: Diagnosis not present

## 2019-06-21 DIAGNOSIS — Z79899 Other long term (current) drug therapy: Secondary | ICD-10-CM | POA: Diagnosis not present

## 2019-06-21 DIAGNOSIS — M545 Low back pain: Secondary | ICD-10-CM | POA: Diagnosis not present

## 2019-06-21 DIAGNOSIS — M170 Bilateral primary osteoarthritis of knee: Secondary | ICD-10-CM | POA: Diagnosis not present

## 2019-06-21 DIAGNOSIS — M797 Fibromyalgia: Secondary | ICD-10-CM | POA: Diagnosis not present

## 2019-06-21 DIAGNOSIS — G894 Chronic pain syndrome: Secondary | ICD-10-CM | POA: Diagnosis not present

## 2019-06-27 DIAGNOSIS — M47812 Spondylosis without myelopathy or radiculopathy, cervical region: Secondary | ICD-10-CM | POA: Diagnosis not present

## 2019-06-30 DIAGNOSIS — R109 Unspecified abdominal pain: Secondary | ICD-10-CM | POA: Diagnosis not present

## 2019-07-02 DIAGNOSIS — R21 Rash and other nonspecific skin eruption: Secondary | ICD-10-CM | POA: Diagnosis not present

## 2019-07-02 DIAGNOSIS — G5600 Carpal tunnel syndrome, unspecified upper limb: Secondary | ICD-10-CM | POA: Diagnosis not present

## 2019-07-02 DIAGNOSIS — M79643 Pain in unspecified hand: Secondary | ICD-10-CM | POA: Diagnosis not present

## 2019-07-02 DIAGNOSIS — M0609 Rheumatoid arthritis without rheumatoid factor, multiple sites: Secondary | ICD-10-CM | POA: Diagnosis not present

## 2019-07-02 DIAGNOSIS — M79673 Pain in unspecified foot: Secondary | ICD-10-CM | POA: Diagnosis not present

## 2019-07-02 DIAGNOSIS — M25561 Pain in right knee: Secondary | ICD-10-CM | POA: Diagnosis not present

## 2019-07-02 DIAGNOSIS — Z79899 Other long term (current) drug therapy: Secondary | ICD-10-CM | POA: Diagnosis not present

## 2019-07-02 DIAGNOSIS — M1990 Unspecified osteoarthritis, unspecified site: Secondary | ICD-10-CM | POA: Diagnosis not present

## 2019-07-02 DIAGNOSIS — M797 Fibromyalgia: Secondary | ICD-10-CM | POA: Diagnosis not present

## 2019-07-04 ENCOUNTER — Ambulatory Visit
Admission: RE | Admit: 2019-07-04 | Discharge: 2019-07-04 | Disposition: A | Discharge status: Home / Self Care | Payer: Medicare HMO | Source: Ambulatory Visit | Attending: Pain Medicine | Admitting: Pain Medicine

## 2019-07-04 DIAGNOSIS — M545 Low back pain, unspecified: Secondary | ICD-10-CM

## 2019-07-05 DIAGNOSIS — R69 Illness, unspecified: Secondary | ICD-10-CM | POA: Diagnosis not present

## 2019-07-11 DIAGNOSIS — G4733 Obstructive sleep apnea (adult) (pediatric): Secondary | ICD-10-CM | POA: Diagnosis not present

## 2019-07-19 DIAGNOSIS — G894 Chronic pain syndrome: Secondary | ICD-10-CM | POA: Diagnosis not present

## 2019-07-19 DIAGNOSIS — M47812 Spondylosis without myelopathy or radiculopathy, cervical region: Secondary | ICD-10-CM | POA: Diagnosis not present

## 2019-07-19 DIAGNOSIS — M170 Bilateral primary osteoarthritis of knee: Secondary | ICD-10-CM | POA: Diagnosis not present

## 2019-07-19 DIAGNOSIS — M545 Low back pain: Secondary | ICD-10-CM | POA: Diagnosis not present

## 2019-07-23 DIAGNOSIS — E118 Type 2 diabetes mellitus with unspecified complications: Secondary | ICD-10-CM | POA: Diagnosis not present

## 2019-07-23 DIAGNOSIS — I25119 Atherosclerotic heart disease of native coronary artery with unspecified angina pectoris: Secondary | ICD-10-CM | POA: Diagnosis not present

## 2019-07-23 DIAGNOSIS — I700 Atherosclerosis of aorta: Secondary | ICD-10-CM | POA: Diagnosis not present

## 2019-07-24 DIAGNOSIS — I208 Other forms of angina pectoris: Secondary | ICD-10-CM | POA: Diagnosis not present

## 2019-07-24 DIAGNOSIS — G4733 Obstructive sleep apnea (adult) (pediatric): Secondary | ICD-10-CM | POA: Diagnosis not present

## 2019-07-24 DIAGNOSIS — L930 Discoid lupus erythematosus: Secondary | ICD-10-CM | POA: Diagnosis not present

## 2019-07-24 DIAGNOSIS — I2510 Atherosclerotic heart disease of native coronary artery without angina pectoris: Secondary | ICD-10-CM | POA: Diagnosis not present

## 2019-07-24 DIAGNOSIS — I10 Essential (primary) hypertension: Secondary | ICD-10-CM | POA: Diagnosis not present

## 2019-07-24 DIAGNOSIS — R519 Headache, unspecified: Secondary | ICD-10-CM | POA: Diagnosis not present

## 2019-07-24 DIAGNOSIS — J449 Chronic obstructive pulmonary disease, unspecified: Secondary | ICD-10-CM | POA: Diagnosis not present

## 2019-07-24 DIAGNOSIS — Z955 Presence of coronary angioplasty implant and graft: Secondary | ICD-10-CM | POA: Diagnosis not present

## 2019-07-24 DIAGNOSIS — R42 Dizziness and giddiness: Secondary | ICD-10-CM | POA: Diagnosis not present

## 2019-07-24 DIAGNOSIS — R0602 Shortness of breath: Secondary | ICD-10-CM | POA: Diagnosis not present

## 2019-07-25 DIAGNOSIS — J449 Chronic obstructive pulmonary disease, unspecified: Secondary | ICD-10-CM | POA: Diagnosis not present

## 2019-07-26 ENCOUNTER — Other Ambulatory Visit: Payer: Self-pay | Admitting: Neurosurgery

## 2019-07-26 DIAGNOSIS — D329 Benign neoplasm of meninges, unspecified: Secondary | ICD-10-CM

## 2019-07-31 DIAGNOSIS — M0609 Rheumatoid arthritis without rheumatoid factor, multiple sites: Secondary | ICD-10-CM | POA: Diagnosis not present

## 2019-07-31 DIAGNOSIS — E118 Type 2 diabetes mellitus with unspecified complications: Secondary | ICD-10-CM | POA: Diagnosis not present

## 2019-07-31 DIAGNOSIS — Z6841 Body Mass Index (BMI) 40.0 and over, adult: Secondary | ICD-10-CM | POA: Diagnosis not present

## 2019-07-31 DIAGNOSIS — M329 Systemic lupus erythematosus, unspecified: Secondary | ICD-10-CM | POA: Diagnosis not present

## 2019-07-31 DIAGNOSIS — I2510 Atherosclerotic heart disease of native coronary artery without angina pectoris: Secondary | ICD-10-CM | POA: Diagnosis not present

## 2019-07-31 DIAGNOSIS — I700 Atherosclerosis of aorta: Secondary | ICD-10-CM | POA: Diagnosis not present

## 2019-07-31 DIAGNOSIS — J449 Chronic obstructive pulmonary disease, unspecified: Secondary | ICD-10-CM | POA: Diagnosis not present

## 2019-07-31 DIAGNOSIS — R569 Unspecified convulsions: Secondary | ICD-10-CM | POA: Diagnosis not present

## 2019-07-31 DIAGNOSIS — R69 Illness, unspecified: Secondary | ICD-10-CM | POA: Diagnosis not present

## 2019-08-02 DIAGNOSIS — Z6841 Body Mass Index (BMI) 40.0 and over, adult: Secondary | ICD-10-CM | POA: Diagnosis not present

## 2019-08-02 DIAGNOSIS — G5602 Carpal tunnel syndrome, left upper limb: Secondary | ICD-10-CM | POA: Diagnosis not present

## 2019-08-02 DIAGNOSIS — M5416 Radiculopathy, lumbar region: Secondary | ICD-10-CM | POA: Diagnosis not present

## 2019-08-02 DIAGNOSIS — I10 Essential (primary) hypertension: Secondary | ICD-10-CM | POA: Diagnosis not present

## 2019-08-06 DIAGNOSIS — M5416 Radiculopathy, lumbar region: Secondary | ICD-10-CM | POA: Diagnosis not present

## 2019-08-07 DIAGNOSIS — Z03818 Encounter for observation for suspected exposure to other biological agents ruled out: Secondary | ICD-10-CM | POA: Diagnosis not present

## 2019-08-07 DIAGNOSIS — R69 Illness, unspecified: Secondary | ICD-10-CM | POA: Diagnosis not present

## 2019-08-11 DIAGNOSIS — G4733 Obstructive sleep apnea (adult) (pediatric): Secondary | ICD-10-CM | POA: Diagnosis not present

## 2019-08-16 DIAGNOSIS — M170 Bilateral primary osteoarthritis of knee: Secondary | ICD-10-CM | POA: Diagnosis not present

## 2019-08-16 DIAGNOSIS — M5136 Other intervertebral disc degeneration, lumbar region: Secondary | ICD-10-CM | POA: Diagnosis not present

## 2019-08-16 DIAGNOSIS — M5134 Other intervertebral disc degeneration, thoracic region: Secondary | ICD-10-CM | POA: Diagnosis not present

## 2019-08-16 DIAGNOSIS — G894 Chronic pain syndrome: Secondary | ICD-10-CM | POA: Diagnosis not present

## 2019-08-20 DIAGNOSIS — M25561 Pain in right knee: Secondary | ICD-10-CM | POA: Diagnosis not present

## 2019-08-20 DIAGNOSIS — M25562 Pain in left knee: Secondary | ICD-10-CM | POA: Diagnosis not present

## 2019-08-20 DIAGNOSIS — G8929 Other chronic pain: Secondary | ICD-10-CM | POA: Diagnosis not present

## 2019-08-20 DIAGNOSIS — R21 Rash and other nonspecific skin eruption: Secondary | ICD-10-CM | POA: Diagnosis not present

## 2019-08-20 DIAGNOSIS — M170 Bilateral primary osteoarthritis of knee: Secondary | ICD-10-CM | POA: Diagnosis not present

## 2019-08-22 DIAGNOSIS — G4733 Obstructive sleep apnea (adult) (pediatric): Secondary | ICD-10-CM | POA: Diagnosis not present

## 2019-08-26 DIAGNOSIS — G4733 Obstructive sleep apnea (adult) (pediatric): Secondary | ICD-10-CM | POA: Diagnosis not present

## 2019-08-27 ENCOUNTER — Other Ambulatory Visit: Payer: Self-pay

## 2019-08-27 ENCOUNTER — Ambulatory Visit
Admission: RE | Admit: 2019-08-27 | Discharge: 2019-08-27 | Disposition: A | Discharge status: Home / Self Care | Payer: Medicare HMO | Source: Ambulatory Visit | Attending: Neurosurgery | Admitting: Neurosurgery

## 2019-08-27 DIAGNOSIS — D329 Benign neoplasm of meninges, unspecified: Secondary | ICD-10-CM | POA: Diagnosis not present

## 2019-08-27 LAB — POCT I-STAT CREATININE: Creatinine, Ser: 0.6 mg/dL (ref 0.44–1.00)

## 2019-08-27 MED ORDER — GADOBUTROL 1 MMOL/ML IV SOLN
10.0000 mL | Freq: Once | INTRAVENOUS | Status: AC | PRN
  Administered 2019-08-27: 11:00:00 10 mL via INTRAVENOUS

## 2019-08-30 DIAGNOSIS — M5416 Radiculopathy, lumbar region: Secondary | ICD-10-CM | POA: Diagnosis not present

## 2019-08-30 DIAGNOSIS — I10 Essential (primary) hypertension: Secondary | ICD-10-CM | POA: Diagnosis not present

## 2019-08-30 DIAGNOSIS — Z6841 Body Mass Index (BMI) 40.0 and over, adult: Secondary | ICD-10-CM | POA: Diagnosis not present

## 2019-09-03 DIAGNOSIS — R21 Rash and other nonspecific skin eruption: Secondary | ICD-10-CM | POA: Diagnosis not present

## 2019-09-03 DIAGNOSIS — M79643 Pain in unspecified hand: Secondary | ICD-10-CM | POA: Diagnosis not present

## 2019-09-03 DIAGNOSIS — Z79899 Other long term (current) drug therapy: Secondary | ICD-10-CM | POA: Diagnosis not present

## 2019-09-03 DIAGNOSIS — M25511 Pain in right shoulder: Secondary | ICD-10-CM | POA: Diagnosis not present

## 2019-09-03 DIAGNOSIS — M797 Fibromyalgia: Secondary | ICD-10-CM | POA: Diagnosis not present

## 2019-09-03 DIAGNOSIS — G5600 Carpal tunnel syndrome, unspecified upper limb: Secondary | ICD-10-CM | POA: Diagnosis not present

## 2019-09-03 DIAGNOSIS — M79673 Pain in unspecified foot: Secondary | ICD-10-CM | POA: Diagnosis not present

## 2019-09-03 DIAGNOSIS — M1990 Unspecified osteoarthritis, unspecified site: Secondary | ICD-10-CM | POA: Diagnosis not present

## 2019-09-03 DIAGNOSIS — M0609 Rheumatoid arthritis without rheumatoid factor, multiple sites: Secondary | ICD-10-CM | POA: Diagnosis not present

## 2019-09-04 DIAGNOSIS — D329 Benign neoplasm of meninges, unspecified: Secondary | ICD-10-CM | POA: Diagnosis not present

## 2019-09-04 DIAGNOSIS — M25511 Pain in right shoulder: Secondary | ICD-10-CM | POA: Diagnosis not present

## 2019-09-04 DIAGNOSIS — M19011 Primary osteoarthritis, right shoulder: Secondary | ICD-10-CM | POA: Diagnosis not present

## 2019-09-04 DIAGNOSIS — E118 Type 2 diabetes mellitus with unspecified complications: Secondary | ICD-10-CM | POA: Diagnosis not present

## 2019-09-04 DIAGNOSIS — R69 Illness, unspecified: Secondary | ICD-10-CM | POA: Diagnosis not present

## 2019-09-04 DIAGNOSIS — E782 Mixed hyperlipidemia: Secondary | ICD-10-CM | POA: Diagnosis not present

## 2019-09-06 DIAGNOSIS — M25562 Pain in left knee: Secondary | ICD-10-CM | POA: Diagnosis not present

## 2019-09-06 DIAGNOSIS — G8929 Other chronic pain: Secondary | ICD-10-CM | POA: Diagnosis not present

## 2019-09-06 DIAGNOSIS — M1711 Unilateral primary osteoarthritis, right knee: Secondary | ICD-10-CM | POA: Diagnosis not present

## 2019-09-06 DIAGNOSIS — M25561 Pain in right knee: Secondary | ICD-10-CM | POA: Diagnosis not present

## 2019-09-11 DIAGNOSIS — M25519 Pain in unspecified shoulder: Secondary | ICD-10-CM | POA: Diagnosis not present

## 2019-09-11 DIAGNOSIS — M170 Bilateral primary osteoarthritis of knee: Secondary | ICD-10-CM | POA: Diagnosis not present

## 2019-09-11 DIAGNOSIS — Z79899 Other long term (current) drug therapy: Secondary | ICD-10-CM | POA: Diagnosis not present

## 2019-09-11 DIAGNOSIS — Z79891 Long term (current) use of opiate analgesic: Secondary | ICD-10-CM | POA: Diagnosis not present

## 2019-09-11 DIAGNOSIS — M47812 Spondylosis without myelopathy or radiculopathy, cervical region: Secondary | ICD-10-CM | POA: Diagnosis not present

## 2019-09-11 DIAGNOSIS — G894 Chronic pain syndrome: Secondary | ICD-10-CM | POA: Diagnosis not present

## 2019-09-13 DIAGNOSIS — G8929 Other chronic pain: Secondary | ICD-10-CM | POA: Diagnosis not present

## 2019-09-13 DIAGNOSIS — M170 Bilateral primary osteoarthritis of knee: Secondary | ICD-10-CM | POA: Diagnosis not present

## 2019-09-13 DIAGNOSIS — M25562 Pain in left knee: Secondary | ICD-10-CM | POA: Diagnosis not present

## 2019-09-13 DIAGNOSIS — M25561 Pain in right knee: Secondary | ICD-10-CM | POA: Diagnosis not present

## 2019-09-18 DIAGNOSIS — G4733 Obstructive sleep apnea (adult) (pediatric): Secondary | ICD-10-CM | POA: Diagnosis not present

## 2019-09-20 DIAGNOSIS — M25561 Pain in right knee: Secondary | ICD-10-CM | POA: Diagnosis not present

## 2019-09-20 DIAGNOSIS — G8929 Other chronic pain: Secondary | ICD-10-CM | POA: Diagnosis not present

## 2019-09-20 DIAGNOSIS — M25562 Pain in left knee: Secondary | ICD-10-CM | POA: Diagnosis not present

## 2019-09-20 DIAGNOSIS — M170 Bilateral primary osteoarthritis of knee: Secondary | ICD-10-CM | POA: Diagnosis not present

## 2019-10-02 DIAGNOSIS — R69 Illness, unspecified: Secondary | ICD-10-CM | POA: Diagnosis not present

## 2019-10-09 DIAGNOSIS — G4733 Obstructive sleep apnea (adult) (pediatric): Secondary | ICD-10-CM | POA: Diagnosis not present

## 2019-10-11 DIAGNOSIS — M25519 Pain in unspecified shoulder: Secondary | ICD-10-CM | POA: Diagnosis not present

## 2019-10-11 DIAGNOSIS — G894 Chronic pain syndrome: Secondary | ICD-10-CM | POA: Diagnosis not present

## 2019-10-11 DIAGNOSIS — M47812 Spondylosis without myelopathy or radiculopathy, cervical region: Secondary | ICD-10-CM | POA: Diagnosis not present

## 2019-10-11 DIAGNOSIS — M170 Bilateral primary osteoarthritis of knee: Secondary | ICD-10-CM | POA: Diagnosis not present

## 2019-10-19 DIAGNOSIS — G4733 Obstructive sleep apnea (adult) (pediatric): Secondary | ICD-10-CM | POA: Diagnosis not present

## 2019-10-20 ENCOUNTER — Encounter: Payer: Self-pay | Admitting: *Deleted

## 2019-10-20 ENCOUNTER — Other Ambulatory Visit: Payer: Self-pay

## 2019-10-20 ENCOUNTER — Emergency Department: Payer: Medicare HMO

## 2019-10-20 ENCOUNTER — Emergency Department
Admission: EM | Admit: 2019-10-20 | Discharge: 2019-10-20 | Disposition: A | Discharge status: Home / Self Care | Payer: Medicare HMO | Attending: Student | Admitting: Student

## 2019-10-20 DIAGNOSIS — Z7984 Long term (current) use of oral hypoglycemic drugs: Secondary | ICD-10-CM | POA: Insufficient documentation

## 2019-10-20 DIAGNOSIS — F17210 Nicotine dependence, cigarettes, uncomplicated: Secondary | ICD-10-CM | POA: Diagnosis not present

## 2019-10-20 DIAGNOSIS — Z79899 Other long term (current) drug therapy: Secondary | ICD-10-CM | POA: Diagnosis not present

## 2019-10-20 DIAGNOSIS — I10 Essential (primary) hypertension: Secondary | ICD-10-CM | POA: Diagnosis not present

## 2019-10-20 DIAGNOSIS — J449 Chronic obstructive pulmonary disease, unspecified: Secondary | ICD-10-CM | POA: Diagnosis not present

## 2019-10-20 DIAGNOSIS — Z7982 Long term (current) use of aspirin: Secondary | ICD-10-CM | POA: Insufficient documentation

## 2019-10-20 DIAGNOSIS — R569 Unspecified convulsions: Secondary | ICD-10-CM

## 2019-10-20 DIAGNOSIS — R69 Illness, unspecified: Secondary | ICD-10-CM | POA: Diagnosis not present

## 2019-10-20 DIAGNOSIS — I252 Old myocardial infarction: Secondary | ICD-10-CM | POA: Diagnosis not present

## 2019-10-20 DIAGNOSIS — D329 Benign neoplasm of meninges, unspecified: Secondary | ICD-10-CM | POA: Diagnosis not present

## 2019-10-20 DIAGNOSIS — J45909 Unspecified asthma, uncomplicated: Secondary | ICD-10-CM | POA: Insufficient documentation

## 2019-10-20 DIAGNOSIS — R404 Transient alteration of awareness: Secondary | ICD-10-CM | POA: Diagnosis not present

## 2019-10-20 DIAGNOSIS — R4182 Altered mental status, unspecified: Secondary | ICD-10-CM | POA: Diagnosis not present

## 2019-10-20 LAB — CBC WITH DIFFERENTIAL/PLATELET
Abs Immature Granulocytes: 0.06 10*3/uL (ref 0.00–0.07)
Basophils Absolute: 0.1 10*3/uL (ref 0.0–0.1)
Basophils Relative: 1 %
Eosinophils Absolute: 0.3 10*3/uL (ref 0.0–0.5)
Eosinophils Relative: 2 %
HCT: 45.9 % (ref 36.0–46.0)
Hemoglobin: 15 g/dL (ref 12.0–15.0)
Immature Granulocytes: 1 %
Lymphocytes Relative: 22 %
Lymphs Abs: 2.9 10*3/uL (ref 0.7–4.0)
MCH: 32.4 pg (ref 26.0–34.0)
MCHC: 32.7 g/dL (ref 30.0–36.0)
MCV: 99.1 fL (ref 80.0–100.0)
Monocytes Absolute: 0.6 10*3/uL (ref 0.1–1.0)
Monocytes Relative: 5 %
Neutro Abs: 9.1 10*3/uL — ABNORMAL HIGH (ref 1.7–7.7)
Neutrophils Relative %: 69 %
Platelets: 262 10*3/uL (ref 150–400)
RBC: 4.63 MIL/uL (ref 3.87–5.11)
RDW: 13.6 % (ref 11.5–15.5)
WBC: 13 10*3/uL — ABNORMAL HIGH (ref 4.0–10.5)
nRBC: 0 % (ref 0.0–0.2)

## 2019-10-20 LAB — BASIC METABOLIC PANEL
Anion gap: 17 — ABNORMAL HIGH (ref 5–15)
BUN: 17 mg/dL (ref 6–20)
CO2: 25 mmol/L (ref 22–32)
Calcium: 9.6 mg/dL (ref 8.9–10.3)
Chloride: 102 mmol/L (ref 98–111)
Creatinine, Ser: 0.78 mg/dL (ref 0.44–1.00)
GFR calc Af Amer: >60 mL/min (ref >60)
GFR calc non Af Amer: >60 mL/min (ref >60)
Glucose, Bld: 108 mg/dL — ABNORMAL HIGH (ref 70–99)
Potassium: 4.2 mmol/L (ref 3.5–5.1)
Sodium: 144 mmol/L (ref 135–145)

## 2019-10-20 LAB — POCT PREGNANCY, URINE: Preg Test, Ur: NEGATIVE

## 2019-10-20 LAB — PREGNANCY, URINE: Preg Test, Ur: NEGATIVE

## 2019-10-20 LAB — GLUCOSE, CAPILLARY: Glucose-Capillary: 145 mg/dL — ABNORMAL HIGH (ref 70–99)

## 2019-10-20 MED ORDER — GADOBUTROL 1 MMOL/ML IV SOLN
10.0000 mL | Freq: Once | INTRAVENOUS | Status: AC | PRN
  Administered 2019-10-20: 10 mL via INTRAVENOUS

## 2019-10-20 NOTE — ED Triage Notes (Signed)
Pt states PMH of seizures in January, x 2. Pt states she felt as if she were going to have a seizure yesterday and her daughter put a cold rag on her head and that prevented it. Pt states today every time she bent over she would get light headed and lose her balance. Pt states just prior to the seizure that caused her to come here she wasn't able to keep her balance after walking the dog. Pt states she "felt it coming on" when she was sitting in the chair and her daughter helped her to the sofa where she had the seizure. Pt states year and half approximate hiatus in seizures. 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 Pt had seizures when she was pregnant w/ her daughter.

## 2019-10-20 NOTE — Discharge Instructions (Addendum)
Thank you for letting us take care of you in the emergency department today.   Please continue to take any regular, prescribed medications.   Please follow up with: - Your Neurology doctor to review your ER visit and follow up on your symptoms.   Please return to the ER for any new or worsening symptoms.

## 2019-10-20 NOTE — ED Triage Notes (Signed)
First Nurse: Patient brought in by ems from home. Patient's daughter called EMS for seizure. EMS reports that patient had one seizure yesterday and two seizures today. EMS reports that patient was initially post ictal. 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

## 2019-10-20 NOTE — ED Notes (Signed)
Pt ready and stable for discharge. Instructions reviewed.

## 2019-10-20 NOTE — ED Provider Notes (Signed)
[facility] Emergency Department Provider Note  ____________________________________________   First MD Initiated Contact with Patient 10/20/19 2107     (approximate)  I have reviewed the triage vital signs and the nursing notes.  History  Chief Complaint Seizures    HPI [name] is a 44 y.o. female with a known history of meningioma (followed by Neurology and [place]) who presents to the emergency department with concern for seizure-like activity.  Patient describes episode of feeling her eyes rolled back in her head, and thereafter feeling like she cannot respond to anyone around her.  She is able to hear what is going on, but cannot respond.  She is aware of this throughout the entire episode, which last several minutes before spontaneously resolved (w/o medication intervention).  Afterwards, she states she is aware the episode happened, might be a little bit confused, but knows who she is, where she is, knows the people around her.  Patient states she has a history of similar episodes to this, has been followed by Neurology for this.  Per chart review, the spells have been determined to be more likely complex migraines versus pseudoseizures.  She has not been on any antiseizure medications.  No medicines were administered today.  Patient states she has been having these episodes with more frequency than normal.  She previously went episode free for almost a year.  Now, she had 2 episodes in January, and 2 so far in February.  She states she can sometimes avoid or temper the episodes with a cool rag to her head.  At present, she has no complaints.  Alert and oriented on arrival.  Denies any speech difficulties, vision changes, weakness, numbness, tingling.   Past Medical Hx Past Medical History:  Diagnosis Date  . Anxiety   . Arthritis    back, knees , neck and feet  . Asthma   . Cancer ([place])    skin  . Collagen vascular disease (HCC)    rheumatoid  arthritis  . COPD (chronic obstructive pulmonary disease) ([place]) 03/2017   mild  . Coronary artery disease   . Diabetes mellitus without complication ([place])   . Endometriosis   . Fibromyalgia   . Gastric ulcer   . GERD (gastroesophageal reflux disease)   . Headache    migraines  . Hyperlipidemia   . Hypertension   . IBS (irritable bowel syndrome)   . Lupus ([place])   . Myocardial infarction ([place]) 08/2009  . Sleep apnea    uses bipap  . Tremors of nervous system    essential tremor is hands    Problem List Patient Active Problem List   Diagnosis Date Noted  . Postoperative state 08/15/2017  . BMI 50.0-59.9, adult ([place]) 06/27/2017  . Lupus ([place]) 06/16/2017  . COPD, mild ([place]) 05/25/2017  . Fibromyalgia 05/25/1997    Past Surgical Hx Past Surgical History:  Procedure Laterality Date  . ABDOMINAL HYSTERECTOMY    . CARDIAC CATHETERIZATION  2010   stent x 1  . CESAREAN SECTION  2006  . CHOLECYSTECTOMY  2008  . COLONOSCOPY WITH PROPOFOL N/A 01/18/2018   Procedure: COLONOSCOPY WITH PROPOFOL;  Surgeon: Toledo, Benay Pike, MD;  Location: [facility] ENDOSCOPY;  Service: Gastroenterology;  Laterality: N/A;  . DIAGNOSTIC LAPAROSCOPY  04/2000  . DILATION AND CURETTAGE OF UTERUS  04/1997 missed ab  . LAPAROSCOPIC OVARIAN CYSTECTOMY Right 2013  . MULTIPLE TOOTH EXTRACTIONS  1998   4 wisdom teeth  . UTERINE STENT PLACEMENT Bilateral 08/15/2017  Procedure: UTERINE STENT PLACEMENT;  Surgeon: Schermerhorn, Gwen Her, MD;  Location: [facility] ORS;  Service: Gynecology;  Laterality: Bilateral;    Medications Prior to Admission medications   Medication Sig Start Date End Date Taking? Authorizing Provider  Abatacept 125 MG/ML SOSY Inject 125 mg into the skin once a week.    [provider]  acetaminophen (TYLENOL) 500 MG tablet Take 500 mg by mouth every 6 (six) hours as needed.    [provider]  albuterol (PROVENTIL HFA;VENTOLIN HFA) 108 (90 Base) MCG/ACT inhaler Inhale 2 puffs into  the lungs every 6 (six) hours as needed for wheezing or shortness of breath.    [provider]  aspirin EC 81 MG tablet Take 81 mg by mouth daily.    [provider]  atorvastatin (LIPITOR) 80 MG tablet Take 80 mg by mouth at bedtime.    [provider]  azelastine (ASTELIN) 0.1 % nasal spray Place 2 sprays into the nose 2 (two) times daily. 04/19/18   [provider]  butalbital-acetaminophen-caffeine (FIORICET, ESGIC) 50-325-40 MG tablet Take 1 tablet by mouth 3 (three) times daily as needed for headache.    [provider]  Cholecalciferol (VITAMIN D3 PO) Take 1,000 Units by mouth daily.    [provider]  cyanocobalamin 1000 MCG tablet Take 1,000 mcg by mouth daily.    [provider]  diphenhydrAMINE (BENADRYL) 25 MG tablet Take 25 mg by mouth every 6 (six) hours as needed.    [provider]  DULoxetine (CYMBALTA) 60 MG capsule Take 60 mg by mouth 2 (two) times daily. 07/13/17   [provider]  empagliflozin (JARDIANCE) 10 MG TABS tablet Take by mouth. 12/13/18 12/13/19  [provider]  Fluticasone-Salmeterol (ADVAIR) 250-50 MCG/DOSE AEPB Inhale 1 puff into the lungs 2 (two) times daily. 10/16/18 10/16/19  [provider]  folic acid (FOLVITE) 1 MG tablet Take 2 mg by mouth daily. 09/20/18   [provider]  hydroxychloroquine (PLAQUENIL) 200 MG tablet Take 200 mg by mouth 2 (two) times daily. 07/13/17   [provider]  ibuprofen (ADVIL,MOTRIN) 800 MG tablet Take 1 tablet (800 mg total) by mouth 3 (three) times daily as needed for moderate pain. 08/18/17   Schermerhorn, Gwen Her, MD  levocetirizine (XYZAL) 5 MG tablet Take 5 mg by mouth every evening.    [provider]  metFORMIN (GLUCOPHAGE-XR) 500 MG 24 hr tablet Take 1,000 mg by mouth daily with supper. 11/29/18 11/29/19  [provider]  Methotrexate Sodium (METHOTREXATE, PF,) 200 MG/8ML injection Inject 20 mg into  the muscle once a week.    [provider]  metoprolol tartrate (LOPRESSOR) 50 MG tablet Take 50 mg by mouth 2 (two) times daily.    [provider]  montelukast (SINGULAIR) 10 MG tablet Take 10 mg by mouth at bedtime.    [provider]  ondansetron (ZOFRAN) 8 MG tablet Take 8 mg by mouth every 8 (eight) hours as needed. for nausea 06/12/18   [provider]  oxyCODONE-acetaminophen (PERCOCET) 10-325 MG tablet Take 1 tablet by mouth every 4 (four) hours as needed for pain.    [provider]  pantoprazole (PROTONIX) 40 MG tablet Take 40 mg by mouth daily.    [provider]  primidone (MYSOLINE) 50 MG tablet Take by mouth 2 (two) times daily.    [provider]  rizatriptan (MAXALT) 10 MG tablet Take by mouth.    [provider]  traZODone (DESYREL)  100 MG tablet Take 100 mg by mouth at bedtime.    [provider]  vitamin C (ASCORBIC ACID) 500 MG tablet Take 500 mg by mouth daily.    [provider]    Allergies Glimepiride, Avelox [moxifloxacin hcl in nacl], Levaquin [levofloxacin in d5w], Penicillins, Sulfa antibiotics, Gabapentin, Macrobid [nitrofurantoin], and Tetracyclines & related  Family Hx No family history on file.  Social Hx Social History   Tobacco Use  . Smoking status: Current Every Day Smoker    Packs/day: 1.00    Types: Cigarettes  . Smokeless tobacco: Never Used  Substance Use Topics  . Alcohol use: No  . Drug use: No     Review of Systems  Constitutional: Negative for fever, chills. Eyes: Negative for visual changes. ENT: Negative for sore throat. Cardiovascular: Negative for chest pain. Respiratory: Negative for shortness of breath. Gastrointestinal: Negative for nausea, vomiting.  Genitourinary: Negative for dysuria. Musculoskeletal: Negative for leg swelling. Skin: Negative for rash. Neurological: Negative for headaches. Positive for seizure like  activity.   Physical Exam  Vital Signs: ED Triage Vitals  Enc Vitals Group     BP 10/20/19 2116 (!) 144/104     Pulse Rate 10/20/19 2116 80     Resp 10/20/19 2116 (!) 29     Temp 10/20/19 2116 98.2 F (36.8 C)     Temp Source 10/20/19 2116 Oral     SpO2 10/20/19 2116 95 %     Weight 10/20/19 2118 300 lb (136.1 kg)     Height 10/20/19 2118 5\' 3"  (1.6 m)     Head Circumference --      Peak Flow --      Pain Score 10/20/19 2117 8     Pain Loc --      Pain Edu? --      Excl. in [place]? --      Constitutional: Alert and oriented.  Head: Normocephalic. Atraumatic. Eyes: Conjunctivae clear. Sclera anicteric. Nose: No congestion. No rhinorrhea. Mouth/Throat: Wearing mask.  Neck: No stridor.   Cardiovascular: Normal rate, regular rhythm. Extremities well perfused. Respiratory: Normal respiratory effort.  Lungs CTAB. Gastrointestinal: Soft. Non-tender. Non-distended.  Musculoskeletal: No lower extremity edema. No deformities. Neurologic:  Normal speech and language. No gross focal neurologic deficits are appreciated. Alert and oriented.  Face symmetric.  Tongue midline.  Cranial nerves II through XII intact. UE and LE strength 5/5 and symmetric. UE and LE SILT.  Skin: Skin is warm, dry and intact. No rash noted. Psychiatric: Mood and affect are appropriate for situation.  EKG  Personally reviewed.   Rate: 81 Rhythm: sinus Axis: normal Intervals: very minimally increased QRS 116 ms No acute ischemic changes No evidence of WPW, Brugada, or Wolff-Parkinson-White No STEMI    Radiology  MRI: IMPRESSION: No change. No abnormality of the brain parenchyma itself. 11-12 mm inferior left frontal meningioma with dural tail. Slight indentation of the left frontal lobe, but no evidence any brain edema. No mass effect. It would be unusual for this be a cause of seizure.   Procedures  Procedure(s) performed (including critical care):  Procedures   Initial Impression /  Assessment and Plan / ED Course  43 y.o. female who presents to the ED for questionable seizure like "spell" as described above.  History of known meningioma.  Followed by Neurology for these spells, which are thought to be complex migraines versus pseudoseizures.  Ddx: complex migraine, pseudoseizure, seizure, electrolyte abnormality, arrhythmia, progression of her known meningioma  Will evaluate  with labs, imaging.  Patient states she already has a follow-up clinic appointment with her Neurology office on Tuesday.   Labs without actionable derangements. MRI without any acute changes. As such, patient stable for discharge with outpatient follow up already in place. Will defer consideration of any AEDs to her primary Neurologist at her follow up appointment given higher concern for pseudoseizure at this time based on description. Patient agreeable. Given return precautions.    Final Clinical Impression(s) / ED Diagnosis  Final diagnoses:  Seizure-like activity ([place])  Spell of altered consciousness       Note:  This document was prepared using Dragon voice recognition software and may include unintentional dictation errors.   Lilia Pro., MD 10/20/19 [phone]

## 2019-10-22 DIAGNOSIS — R69 Illness, unspecified: Secondary | ICD-10-CM | POA: Diagnosis not present

## 2019-10-23 DIAGNOSIS — R419 Unspecified symptoms and signs involving cognitive functions and awareness: Secondary | ICD-10-CM | POA: Diagnosis not present

## 2019-10-23 DIAGNOSIS — G43111 Migraine with aura, intractable, with status migrainosus: Secondary | ICD-10-CM | POA: Diagnosis not present

## 2019-10-23 DIAGNOSIS — D329 Benign neoplasm of meninges, unspecified: Secondary | ICD-10-CM | POA: Diagnosis not present

## 2019-10-23 DIAGNOSIS — G4733 Obstructive sleep apnea (adult) (pediatric): Secondary | ICD-10-CM | POA: Diagnosis not present

## 2019-10-23 DIAGNOSIS — R404 Transient alteration of awareness: Secondary | ICD-10-CM | POA: Diagnosis not present

## 2019-10-23 DIAGNOSIS — E611 Iron deficiency: Secondary | ICD-10-CM | POA: Diagnosis not present

## 2019-10-23 DIAGNOSIS — R7989 Other specified abnormal findings of blood chemistry: Secondary | ICD-10-CM | POA: Diagnosis not present

## 2019-10-30 ENCOUNTER — Other Ambulatory Visit
Admission: RE | Admit: 2019-10-30 | Discharge: 2019-10-30 | Disposition: A | Discharge status: Home / Self Care | Payer: Medicare HMO | Source: Ambulatory Visit | Attending: Pulmonary Disease | Admitting: Pulmonary Disease

## 2019-10-30 ENCOUNTER — Other Ambulatory Visit: Payer: Self-pay | Admitting: Pulmonary Disease

## 2019-10-30 DIAGNOSIS — R0602 Shortness of breath: Secondary | ICD-10-CM | POA: Insufficient documentation

## 2019-10-30 DIAGNOSIS — Z1331 Encounter for screening for depression: Secondary | ICD-10-CM | POA: Diagnosis not present

## 2019-10-30 DIAGNOSIS — R69 Illness, unspecified: Secondary | ICD-10-CM | POA: Diagnosis not present

## 2019-10-30 LAB — FIBRIN DERIVATIVES D-DIMER (ARMC ONLY): Fibrin derivatives D-dimer (ARMC): 687.32 ng/mL (FEU) — ABNORMAL HIGH (ref 0.00–499.00)

## 2019-11-01 ENCOUNTER — Ambulatory Visit
Admission: RE | Admit: 2019-11-01 | Discharge: 2019-11-01 | Disposition: A | Discharge status: Home / Self Care | Payer: Medicare HMO | Source: Ambulatory Visit | Attending: Pulmonary Disease | Admitting: Pulmonary Disease

## 2019-11-01 ENCOUNTER — Other Ambulatory Visit: Payer: Self-pay

## 2019-11-01 DIAGNOSIS — R600 Localized edema: Secondary | ICD-10-CM | POA: Diagnosis not present

## 2019-11-01 DIAGNOSIS — R0602 Shortness of breath: Secondary | ICD-10-CM | POA: Insufficient documentation

## 2019-11-06 DIAGNOSIS — M5416 Radiculopathy, lumbar region: Secondary | ICD-10-CM | POA: Diagnosis not present

## 2019-11-07 ENCOUNTER — Ambulatory Visit: Payer: Medicare HMO

## 2019-11-07 DIAGNOSIS — R413 Other amnesia: Secondary | ICD-10-CM | POA: Diagnosis not present

## 2019-11-08 DIAGNOSIS — M170 Bilateral primary osteoarthritis of knee: Secondary | ICD-10-CM | POA: Diagnosis not present

## 2019-11-08 DIAGNOSIS — G894 Chronic pain syndrome: Secondary | ICD-10-CM | POA: Diagnosis not present

## 2019-11-08 DIAGNOSIS — Z79899 Other long term (current) drug therapy: Secondary | ICD-10-CM | POA: Diagnosis not present

## 2019-11-08 DIAGNOSIS — Z79891 Long term (current) use of opiate analgesic: Secondary | ICD-10-CM | POA: Diagnosis not present

## 2019-11-08 DIAGNOSIS — M25519 Pain in unspecified shoulder: Secondary | ICD-10-CM | POA: Diagnosis not present

## 2019-11-08 DIAGNOSIS — M47812 Spondylosis without myelopathy or radiculopathy, cervical region: Secondary | ICD-10-CM | POA: Diagnosis not present

## 2019-11-09 DIAGNOSIS — G4733 Obstructive sleep apnea (adult) (pediatric): Secondary | ICD-10-CM | POA: Diagnosis not present

## 2019-11-16 DIAGNOSIS — R413 Other amnesia: Secondary | ICD-10-CM | POA: Diagnosis not present

## 2019-11-16 DIAGNOSIS — G4733 Obstructive sleep apnea (adult) (pediatric): Secondary | ICD-10-CM | POA: Diagnosis not present

## 2019-11-23 DIAGNOSIS — R69 Illness, unspecified: Secondary | ICD-10-CM | POA: Diagnosis not present

## 2019-12-03 DIAGNOSIS — G5600 Carpal tunnel syndrome, unspecified upper limb: Secondary | ICD-10-CM | POA: Diagnosis not present

## 2019-12-03 DIAGNOSIS — R21 Rash and other nonspecific skin eruption: Secondary | ICD-10-CM | POA: Diagnosis not present

## 2019-12-03 DIAGNOSIS — M797 Fibromyalgia: Secondary | ICD-10-CM | POA: Diagnosis not present

## 2019-12-03 DIAGNOSIS — M1990 Unspecified osteoarthritis, unspecified site: Secondary | ICD-10-CM | POA: Diagnosis not present

## 2019-12-03 DIAGNOSIS — M0609 Rheumatoid arthritis without rheumatoid factor, multiple sites: Secondary | ICD-10-CM | POA: Diagnosis not present

## 2019-12-03 DIAGNOSIS — M79673 Pain in unspecified foot: Secondary | ICD-10-CM | POA: Diagnosis not present

## 2019-12-03 DIAGNOSIS — M79643 Pain in unspecified hand: Secondary | ICD-10-CM | POA: Diagnosis not present

## 2019-12-03 DIAGNOSIS — Z79899 Other long term (current) drug therapy: Secondary | ICD-10-CM | POA: Diagnosis not present

## 2019-12-03 DIAGNOSIS — M25511 Pain in right shoulder: Secondary | ICD-10-CM | POA: Diagnosis not present

## 2019-12-04 DIAGNOSIS — M5416 Radiculopathy, lumbar region: Secondary | ICD-10-CM | POA: Diagnosis not present

## 2019-12-05 DIAGNOSIS — R413 Other amnesia: Secondary | ICD-10-CM | POA: Diagnosis not present

## 2019-12-05 DIAGNOSIS — F331 Major depressive disorder, recurrent, moderate: Secondary | ICD-10-CM | POA: Diagnosis not present

## 2019-12-05 DIAGNOSIS — G3184 Mild cognitive impairment, so stated: Secondary | ICD-10-CM | POA: Diagnosis not present

## 2019-12-07 DIAGNOSIS — G4733 Obstructive sleep apnea (adult) (pediatric): Secondary | ICD-10-CM | POA: Diagnosis not present

## 2019-12-27 DIAGNOSIS — I10 Essential (primary) hypertension: Secondary | ICD-10-CM | POA: Diagnosis not present

## 2019-12-27 DIAGNOSIS — M25561 Pain in right knee: Secondary | ICD-10-CM | POA: Diagnosis not present

## 2019-12-27 DIAGNOSIS — E782 Mixed hyperlipidemia: Secondary | ICD-10-CM | POA: Diagnosis not present

## 2019-12-27 DIAGNOSIS — M545 Low back pain: Secondary | ICD-10-CM | POA: Diagnosis not present

## 2019-12-27 DIAGNOSIS — Z6841 Body Mass Index (BMI) 40.0 and over, adult: Secondary | ICD-10-CM | POA: Diagnosis not present

## 2019-12-27 DIAGNOSIS — E118 Type 2 diabetes mellitus with unspecified complications: Secondary | ICD-10-CM | POA: Diagnosis not present

## 2020-01-03 DIAGNOSIS — E118 Type 2 diabetes mellitus with unspecified complications: Secondary | ICD-10-CM | POA: Diagnosis not present

## 2020-01-03 DIAGNOSIS — E782 Mixed hyperlipidemia: Secondary | ICD-10-CM | POA: Diagnosis not present

## 2020-01-03 DIAGNOSIS — I700 Atherosclerosis of aorta: Secondary | ICD-10-CM | POA: Diagnosis not present

## 2020-01-03 DIAGNOSIS — R69 Illness, unspecified: Secondary | ICD-10-CM | POA: Diagnosis not present

## 2020-01-03 DIAGNOSIS — I25119 Atherosclerotic heart disease of native coronary artery with unspecified angina pectoris: Secondary | ICD-10-CM | POA: Diagnosis not present

## 2020-01-03 DIAGNOSIS — G4739 Other sleep apnea: Secondary | ICD-10-CM | POA: Diagnosis not present

## 2020-01-03 DIAGNOSIS — M0609 Rheumatoid arthritis without rheumatoid factor, multiple sites: Secondary | ICD-10-CM | POA: Diagnosis not present

## 2020-01-03 DIAGNOSIS — J449 Chronic obstructive pulmonary disease, unspecified: Secondary | ICD-10-CM | POA: Diagnosis not present

## 2020-01-03 DIAGNOSIS — Z9989 Dependence on other enabling machines and devices: Secondary | ICD-10-CM | POA: Diagnosis not present

## 2020-01-04 DIAGNOSIS — G8929 Other chronic pain: Secondary | ICD-10-CM | POA: Diagnosis not present

## 2020-01-04 DIAGNOSIS — M25562 Pain in left knee: Secondary | ICD-10-CM | POA: Diagnosis not present

## 2020-01-04 DIAGNOSIS — M7122 Synovial cyst of popliteal space [Baker], left knee: Secondary | ICD-10-CM | POA: Diagnosis not present

## 2020-01-04 DIAGNOSIS — M25462 Effusion, left knee: Secondary | ICD-10-CM | POA: Diagnosis not present

## 2020-01-04 DIAGNOSIS — M25561 Pain in right knee: Secondary | ICD-10-CM | POA: Diagnosis not present

## 2020-01-04 DIAGNOSIS — M170 Bilateral primary osteoarthritis of knee: Secondary | ICD-10-CM | POA: Diagnosis not present

## 2020-01-07 DIAGNOSIS — G4733 Obstructive sleep apnea (adult) (pediatric): Secondary | ICD-10-CM | POA: Diagnosis not present

## 2020-01-10 DIAGNOSIS — G4733 Obstructive sleep apnea (adult) (pediatric): Secondary | ICD-10-CM | POA: Diagnosis not present

## 2020-01-17 DIAGNOSIS — R0989 Other specified symptoms and signs involving the circulatory and respiratory systems: Secondary | ICD-10-CM | POA: Diagnosis not present

## 2020-01-17 DIAGNOSIS — Z20822 Contact with and (suspected) exposure to covid-19: Secondary | ICD-10-CM | POA: Diagnosis not present

## 2020-01-29 DIAGNOSIS — M25561 Pain in right knee: Secondary | ICD-10-CM | POA: Diagnosis not present

## 2020-01-30 DIAGNOSIS — R69 Illness, unspecified: Secondary | ICD-10-CM | POA: Diagnosis not present

## 2020-01-30 DIAGNOSIS — G250 Essential tremor: Secondary | ICD-10-CM | POA: Diagnosis not present

## 2020-01-30 DIAGNOSIS — R200 Anesthesia of skin: Secondary | ICD-10-CM | POA: Diagnosis not present

## 2020-01-30 DIAGNOSIS — G43111 Migraine with aura, intractable, with status migrainosus: Secondary | ICD-10-CM | POA: Diagnosis not present

## 2020-01-30 DIAGNOSIS — G4733 Obstructive sleep apnea (adult) (pediatric): Secondary | ICD-10-CM | POA: Diagnosis not present

## 2020-01-30 DIAGNOSIS — D329 Benign neoplasm of meninges, unspecified: Secondary | ICD-10-CM | POA: Diagnosis not present

## 2020-02-05 DIAGNOSIS — M25511 Pain in right shoulder: Secondary | ICD-10-CM | POA: Diagnosis not present

## 2020-02-05 DIAGNOSIS — M1990 Unspecified osteoarthritis, unspecified site: Secondary | ICD-10-CM | POA: Diagnosis not present

## 2020-02-05 DIAGNOSIS — G5600 Carpal tunnel syndrome, unspecified upper limb: Secondary | ICD-10-CM | POA: Diagnosis not present

## 2020-02-05 DIAGNOSIS — M0609 Rheumatoid arthritis without rheumatoid factor, multiple sites: Secondary | ICD-10-CM | POA: Diagnosis not present

## 2020-02-05 DIAGNOSIS — M79673 Pain in unspecified foot: Secondary | ICD-10-CM | POA: Diagnosis not present

## 2020-02-05 DIAGNOSIS — R21 Rash and other nonspecific skin eruption: Secondary | ICD-10-CM | POA: Diagnosis not present

## 2020-02-05 DIAGNOSIS — M79643 Pain in unspecified hand: Secondary | ICD-10-CM | POA: Diagnosis not present

## 2020-02-05 DIAGNOSIS — Z79899 Other long term (current) drug therapy: Secondary | ICD-10-CM | POA: Diagnosis not present

## 2020-02-05 DIAGNOSIS — M797 Fibromyalgia: Secondary | ICD-10-CM | POA: Diagnosis not present

## 2020-02-05 DIAGNOSIS — R0602 Shortness of breath: Secondary | ICD-10-CM | POA: Diagnosis not present

## 2020-02-05 DIAGNOSIS — J449 Chronic obstructive pulmonary disease, unspecified: Secondary | ICD-10-CM | POA: Diagnosis not present

## 2020-02-06 DIAGNOSIS — G4733 Obstructive sleep apnea (adult) (pediatric): Secondary | ICD-10-CM | POA: Diagnosis not present

## 2020-02-09 DIAGNOSIS — G4733 Obstructive sleep apnea (adult) (pediatric): Secondary | ICD-10-CM | POA: Diagnosis not present

## 2020-02-18 DIAGNOSIS — G5603 Carpal tunnel syndrome, bilateral upper limbs: Secondary | ICD-10-CM | POA: Diagnosis not present

## 2020-02-18 DIAGNOSIS — Z79899 Other long term (current) drug therapy: Secondary | ICD-10-CM | POA: Diagnosis not present

## 2020-02-26 DIAGNOSIS — M545 Low back pain: Secondary | ICD-10-CM | POA: Diagnosis not present

## 2020-02-26 DIAGNOSIS — M25561 Pain in right knee: Secondary | ICD-10-CM | POA: Diagnosis not present

## 2020-02-26 DIAGNOSIS — M542 Cervicalgia: Secondary | ICD-10-CM | POA: Diagnosis not present

## 2020-02-27 DIAGNOSIS — G5603 Carpal tunnel syndrome, bilateral upper limbs: Secondary | ICD-10-CM | POA: Diagnosis not present

## 2020-02-29 DIAGNOSIS — G5603 Carpal tunnel syndrome, bilateral upper limbs: Secondary | ICD-10-CM | POA: Diagnosis not present

## 2020-03-06 DIAGNOSIS — E119 Type 2 diabetes mellitus without complications: Secondary | ICD-10-CM | POA: Diagnosis not present

## 2020-03-06 DIAGNOSIS — B372 Candidiasis of skin and nail: Secondary | ICD-10-CM | POA: Diagnosis not present

## 2020-03-06 DIAGNOSIS — Z6841 Body Mass Index (BMI) 40.0 and over, adult: Secondary | ICD-10-CM | POA: Diagnosis not present

## 2020-03-08 DIAGNOSIS — G4733 Obstructive sleep apnea (adult) (pediatric): Secondary | ICD-10-CM | POA: Diagnosis not present

## 2020-03-11 DIAGNOSIS — G4733 Obstructive sleep apnea (adult) (pediatric): Secondary | ICD-10-CM | POA: Diagnosis not present

## 2020-03-22 DIAGNOSIS — Z20822 Contact with and (suspected) exposure to covid-19: Secondary | ICD-10-CM | POA: Diagnosis not present

## 2020-03-25 DIAGNOSIS — R69 Illness, unspecified: Secondary | ICD-10-CM | POA: Diagnosis not present

## 2020-03-25 DIAGNOSIS — U071 COVID-19: Secondary | ICD-10-CM | POA: Diagnosis not present

## 2020-03-25 DIAGNOSIS — R05 Cough: Secondary | ICD-10-CM | POA: Diagnosis not present

## 2020-03-25 DIAGNOSIS — R5383 Other fatigue: Secondary | ICD-10-CM | POA: Diagnosis not present

## 2020-03-25 DIAGNOSIS — J028 Acute pharyngitis due to other specified organisms: Secondary | ICD-10-CM | POA: Diagnosis not present

## 2020-03-25 DIAGNOSIS — B9789 Other viral agents as the cause of diseases classified elsewhere: Secondary | ICD-10-CM | POA: Diagnosis not present

## 2020-03-25 DIAGNOSIS — E119 Type 2 diabetes mellitus without complications: Secondary | ICD-10-CM | POA: Diagnosis not present

## 2020-03-26 DIAGNOSIS — U071 COVID-19: Secondary | ICD-10-CM | POA: Diagnosis not present

## 2020-03-28 DIAGNOSIS — U071 COVID-19: Secondary | ICD-10-CM | POA: Diagnosis not present

## 2020-03-28 DIAGNOSIS — J449 Chronic obstructive pulmonary disease, unspecified: Secondary | ICD-10-CM | POA: Diagnosis not present

## 2020-03-28 DIAGNOSIS — E118 Type 2 diabetes mellitus with unspecified complications: Secondary | ICD-10-CM | POA: Diagnosis not present

## 2020-04-07 DIAGNOSIS — G4733 Obstructive sleep apnea (adult) (pediatric): Secondary | ICD-10-CM | POA: Diagnosis not present

## 2020-04-10 DIAGNOSIS — E119 Type 2 diabetes mellitus without complications: Secondary | ICD-10-CM | POA: Diagnosis not present

## 2020-04-10 DIAGNOSIS — B372 Candidiasis of skin and nail: Secondary | ICD-10-CM | POA: Diagnosis not present

## 2020-04-10 DIAGNOSIS — Z6841 Body Mass Index (BMI) 40.0 and over, adult: Secondary | ICD-10-CM | POA: Diagnosis not present

## 2020-04-14 DIAGNOSIS — G4733 Obstructive sleep apnea (adult) (pediatric): Secondary | ICD-10-CM | POA: Diagnosis not present

## 2020-04-14 DIAGNOSIS — M25561 Pain in right knee: Secondary | ICD-10-CM | POA: Diagnosis not present

## 2020-04-25 DIAGNOSIS — M170 Bilateral primary osteoarthritis of knee: Secondary | ICD-10-CM | POA: Diagnosis not present

## 2020-04-25 DIAGNOSIS — M25562 Pain in left knee: Secondary | ICD-10-CM | POA: Diagnosis not present

## 2020-04-25 DIAGNOSIS — G8929 Other chronic pain: Secondary | ICD-10-CM | POA: Diagnosis not present

## 2020-04-25 DIAGNOSIS — Z6841 Body Mass Index (BMI) 40.0 and over, adult: Secondary | ICD-10-CM | POA: Diagnosis not present

## 2020-04-25 DIAGNOSIS — M7122 Synovial cyst of popliteal space [Baker], left knee: Secondary | ICD-10-CM | POA: Diagnosis not present

## 2020-04-25 DIAGNOSIS — M25561 Pain in right knee: Secondary | ICD-10-CM | POA: Diagnosis not present

## 2020-05-01 DIAGNOSIS — I25119 Atherosclerotic heart disease of native coronary artery with unspecified angina pectoris: Secondary | ICD-10-CM | POA: Diagnosis not present

## 2020-05-01 DIAGNOSIS — E118 Type 2 diabetes mellitus with unspecified complications: Secondary | ICD-10-CM | POA: Diagnosis not present

## 2020-05-01 DIAGNOSIS — E782 Mixed hyperlipidemia: Secondary | ICD-10-CM | POA: Diagnosis not present

## 2020-05-01 DIAGNOSIS — G5603 Carpal tunnel syndrome, bilateral upper limbs: Secondary | ICD-10-CM | POA: Diagnosis not present

## 2020-05-01 DIAGNOSIS — M0609 Rheumatoid arthritis without rheumatoid factor, multiple sites: Secondary | ICD-10-CM | POA: Diagnosis not present

## 2020-05-01 DIAGNOSIS — M791 Myalgia, unspecified site: Secondary | ICD-10-CM | POA: Diagnosis not present

## 2020-05-01 DIAGNOSIS — G250 Essential tremor: Secondary | ICD-10-CM | POA: Diagnosis not present

## 2020-05-01 DIAGNOSIS — Z6841 Body Mass Index (BMI) 40.0 and over, adult: Secondary | ICD-10-CM | POA: Diagnosis not present

## 2020-05-01 DIAGNOSIS — G43111 Migraine with aura, intractable, with status migrainosus: Secondary | ICD-10-CM | POA: Diagnosis not present

## 2020-05-01 DIAGNOSIS — G629 Polyneuropathy, unspecified: Secondary | ICD-10-CM | POA: Diagnosis not present

## 2020-05-01 DIAGNOSIS — R69 Illness, unspecified: Secondary | ICD-10-CM | POA: Diagnosis not present

## 2020-05-01 DIAGNOSIS — D329 Benign neoplasm of meninges, unspecified: Secondary | ICD-10-CM | POA: Diagnosis not present

## 2020-05-01 DIAGNOSIS — G4733 Obstructive sleep apnea (adult) (pediatric): Secondary | ICD-10-CM | POA: Diagnosis not present

## 2020-05-07 DIAGNOSIS — M0609 Rheumatoid arthritis without rheumatoid factor, multiple sites: Secondary | ICD-10-CM | POA: Diagnosis not present

## 2020-05-07 DIAGNOSIS — R21 Rash and other nonspecific skin eruption: Secondary | ICD-10-CM | POA: Diagnosis not present

## 2020-05-07 DIAGNOSIS — M797 Fibromyalgia: Secondary | ICD-10-CM | POA: Diagnosis not present

## 2020-05-07 DIAGNOSIS — M1990 Unspecified osteoarthritis, unspecified site: Secondary | ICD-10-CM | POA: Diagnosis not present

## 2020-05-07 DIAGNOSIS — M25511 Pain in right shoulder: Secondary | ICD-10-CM | POA: Diagnosis not present

## 2020-05-07 DIAGNOSIS — M79643 Pain in unspecified hand: Secondary | ICD-10-CM | POA: Diagnosis not present

## 2020-05-07 DIAGNOSIS — Z79899 Other long term (current) drug therapy: Secondary | ICD-10-CM | POA: Diagnosis not present

## 2020-05-07 DIAGNOSIS — G5600 Carpal tunnel syndrome, unspecified upper limb: Secondary | ICD-10-CM | POA: Diagnosis not present

## 2020-05-07 DIAGNOSIS — M79673 Pain in unspecified foot: Secondary | ICD-10-CM | POA: Diagnosis not present

## 2020-05-07 DIAGNOSIS — J42 Unspecified chronic bronchitis: Secondary | ICD-10-CM | POA: Diagnosis not present

## 2020-05-08 DIAGNOSIS — E118 Type 2 diabetes mellitus with unspecified complications: Secondary | ICD-10-CM | POA: Diagnosis not present

## 2020-05-08 DIAGNOSIS — G4739 Other sleep apnea: Secondary | ICD-10-CM | POA: Diagnosis not present

## 2020-05-08 DIAGNOSIS — G4733 Obstructive sleep apnea (adult) (pediatric): Secondary | ICD-10-CM | POA: Diagnosis not present

## 2020-05-08 DIAGNOSIS — R69 Illness, unspecified: Secondary | ICD-10-CM | POA: Diagnosis not present

## 2020-05-08 DIAGNOSIS — I700 Atherosclerosis of aorta: Secondary | ICD-10-CM | POA: Diagnosis not present

## 2020-05-08 DIAGNOSIS — R569 Unspecified convulsions: Secondary | ICD-10-CM | POA: Diagnosis not present

## 2020-05-08 DIAGNOSIS — M0609 Rheumatoid arthritis without rheumatoid factor, multiple sites: Secondary | ICD-10-CM | POA: Diagnosis not present

## 2020-05-08 DIAGNOSIS — E782 Mixed hyperlipidemia: Secondary | ICD-10-CM | POA: Diagnosis not present

## 2020-05-08 DIAGNOSIS — J449 Chronic obstructive pulmonary disease, unspecified: Secondary | ICD-10-CM | POA: Diagnosis not present

## 2020-05-08 DIAGNOSIS — I25119 Atherosclerotic heart disease of native coronary artery with unspecified angina pectoris: Secondary | ICD-10-CM | POA: Diagnosis not present

## 2020-05-15 DIAGNOSIS — M545 Low back pain: Secondary | ICD-10-CM | POA: Diagnosis not present

## 2020-05-15 DIAGNOSIS — G4733 Obstructive sleep apnea (adult) (pediatric): Secondary | ICD-10-CM | POA: Diagnosis not present

## 2020-05-15 DIAGNOSIS — M542 Cervicalgia: Secondary | ICD-10-CM | POA: Diagnosis not present

## 2020-05-20 DIAGNOSIS — E049 Nontoxic goiter, unspecified: Secondary | ICD-10-CM | POA: Diagnosis not present

## 2020-05-20 DIAGNOSIS — J449 Chronic obstructive pulmonary disease, unspecified: Secondary | ICD-10-CM | POA: Diagnosis not present

## 2020-05-23 DIAGNOSIS — B370 Candidal stomatitis: Secondary | ICD-10-CM | POA: Diagnosis not present

## 2020-05-23 DIAGNOSIS — L03116 Cellulitis of left lower limb: Secondary | ICD-10-CM | POA: Diagnosis not present

## 2020-05-23 DIAGNOSIS — E119 Type 2 diabetes mellitus without complications: Secondary | ICD-10-CM | POA: Diagnosis not present

## 2020-05-26 ENCOUNTER — Other Ambulatory Visit: Payer: Self-pay | Admitting: Obstetrics & Gynecology

## 2020-05-26 DIAGNOSIS — Z1231 Encounter for screening mammogram for malignant neoplasm of breast: Secondary | ICD-10-CM

## 2020-05-26 DIAGNOSIS — R69 Illness, unspecified: Secondary | ICD-10-CM | POA: Diagnosis not present

## 2020-05-26 DIAGNOSIS — Z01419 Encounter for gynecological examination (general) (routine) without abnormal findings: Secondary | ICD-10-CM | POA: Diagnosis not present

## 2020-05-26 DIAGNOSIS — R102 Pelvic and perineal pain: Secondary | ICD-10-CM | POA: Diagnosis not present

## 2020-05-26 DIAGNOSIS — Z6841 Body Mass Index (BMI) 40.0 and over, adult: Secondary | ICD-10-CM | POA: Diagnosis not present

## 2020-05-26 DIAGNOSIS — R350 Frequency of micturition: Secondary | ICD-10-CM | POA: Diagnosis not present

## 2020-05-26 DIAGNOSIS — Z1331 Encounter for screening for depression: Secondary | ICD-10-CM | POA: Diagnosis not present

## 2020-05-26 DIAGNOSIS — Z1239 Encounter for other screening for malignant neoplasm of breast: Secondary | ICD-10-CM | POA: Diagnosis not present

## 2020-05-27 DIAGNOSIS — H5213 Myopia, bilateral: Secondary | ICD-10-CM | POA: Diagnosis not present

## 2020-05-30 DIAGNOSIS — G5602 Carpal tunnel syndrome, left upper limb: Secondary | ICD-10-CM | POA: Diagnosis not present

## 2020-05-30 DIAGNOSIS — G5601 Carpal tunnel syndrome, right upper limb: Secondary | ICD-10-CM | POA: Diagnosis not present

## 2020-05-30 DIAGNOSIS — G5603 Carpal tunnel syndrome, bilateral upper limbs: Secondary | ICD-10-CM | POA: Diagnosis not present

## 2020-06-08 DIAGNOSIS — G4733 Obstructive sleep apnea (adult) (pediatric): Secondary | ICD-10-CM | POA: Diagnosis not present

## 2020-06-09 ENCOUNTER — Other Ambulatory Visit
Admission: RE | Admit: 2020-06-09 | Discharge: 2020-06-09 | Disposition: A | Discharge status: Home / Self Care | Payer: Medicare HMO | Source: Ambulatory Visit | Attending: Orthopedic Surgery | Admitting: Orthopedic Surgery

## 2020-06-09 ENCOUNTER — Encounter (HOSPITAL_COMMUNITY): Payer: Self-pay | Admitting: Orthopedic Surgery

## 2020-06-09 ENCOUNTER — Other Ambulatory Visit: Payer: Self-pay

## 2020-06-09 DIAGNOSIS — Z20822 Contact with and (suspected) exposure to covid-19: Secondary | ICD-10-CM | POA: Diagnosis not present

## 2020-06-09 DIAGNOSIS — Z01812 Encounter for preprocedural laboratory examination: Secondary | ICD-10-CM | POA: Diagnosis not present

## 2020-06-09 LAB — SARS CORONAVIRUS 2 (TAT 6-24 HRS): SARS Coronavirus 2: NEGATIVE

## 2020-06-09 NOTE — Anesthesia Preprocedure Evaluation (Addendum)
Anesthesia Evaluation    Airway Mallampati: III  TM Distance: >3 FB Neck ROM: Full    Dental  (+) Teeth Intact, Poor Dentition   Pulmonary asthma , sleep apnea and Continuous Positive Airway Pressure Ventilation , pneumonia, resolved, COPD,  COPD inhaler, Current Smoker and Patient abstained from smoking.,    breath sounds clear to auscultation + decreased breath sounds      Cardiovascular hypertension, Pt. on medications and Pt. on home beta blockers + CAD and + Past MI   Rhythm:Regular Rate:Normal  MI 08/2009    Neuro/Psych  Headaches, PSYCHIATRIC DISORDERS Anxiety Depression Essential tremor  Neuromuscular disease    GI/Hepatic Neg liver ROS, PUD, GERD  Medicated and Controlled,IBS   Endo/Other  diabetes, Well Controlled, Type 2Morbid obesityHyperlipidemia  Renal/GU negative Renal ROS  negative genitourinary   Musculoskeletal  (+) Arthritis , Rheumatoid disorders,  Fibromyalgia -Systemic Lupus Erythematosus   Abdominal (+) + obese,   Peds  Hematology   Anesthesia Other Findings   Reproductive/Obstetrics                            Anesthesia Physical Anesthesia Plan  ASA: III  Anesthesia Plan: MAC   Post-op Pain Management:    Induction: Intravenous  PONV Risk Score and Plan: 2 and Propofol infusion, Treatment may vary due to age or medical condition, Midazolam and Ondansetron  Airway Management Planned: Natural Airway, Nasal Cannula and Simple Face Mask  Additional Equipment:   Intra-op Plan:   Post-operative Plan:   Informed Consent:   Plan Discussed with:   Anesthesia Plan Comments: (PAT note written 06/09/2020 by Myra Gianotti, PA-C. )       Anesthesia Quick Evaluation

## 2020-06-09 NOTE — Progress Notes (Signed)
Anesthesia Chart Review: Kathleene Hazel   Case: [id number] Date/Time: 06/11/20 1530   Procedure: CARPAL TUNNEL RELEASE (Left ) - with local anesthesia   Anesthesia type: Monitor Anesthesia Care   Pre-op diagnosis: Left carpal tunnel syndrome   [facility]: [facility] OR ROOM 04 / [place] OR   Surgeons: Iran Planas, MD      DISCUSSION: Patient is a 44 year old female scheduled for the above procedure.  History includes smoking, CAD (MI, s/p LAD stent ~ 2010 in [place]; patent stent with mild non-obstructive CAD 12/31/13), lupus, RA, DM2, fibromyalgia, tremor, COPD, HTN, HLD, asthma, endometriosis, anxiety, GERD, IBS, OSA (CPAP/BiPAP), skin cancer.  She receives most of her care through [facility] many specialists as outlined below. Last pulmonology 05/20/20. Last cardiology 07/24/19 with one year follow-up recommended--he notes history of stable angina (since before 2018; patent stent with otherwise mild CAD 2015, last stress test 06/2017). She denied any recent chest pain per PAT RN phone interview.   A1c 8.3% on 05/01/20 [facility]).  06/09/2020 presurgical COVID-19 test is in process.  She is a same-day work-up, so she is for labs and anesthesia team evaluation on the day of surgery.   VS: LMP 07/21/2017   BP Readings from Last 3 Encounters:  10/20/19 (!) 138/99  12/13/18 130/84  11/24/18 (!) 140/94    PROVIDERS: Kirk Ruths, MD is PCP [facility], [place]) - Ottie Glazier, MD is pulmonologist [facility], [place]). Last evaluation 05/20/20. IS recommended for atelectasis on CXR.  Continue Advair and Combivent.  D-dimer and lower extremity venous US ordered to evaluate for thrombotic disease (negative DVT).  Smoking cessation recommended.   Lujean Amel, MD is cardiologist [facility], [place]). Established since 06/09/17. Last visit 07/24/19. History of CAD/PCI in [place] with stable angina. One year follow-up planned.  Jennings Books, MD is neurologist [facility], [place]). Last evaluation 05/01/20. 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 Continue Maxalt, Emgality, Nurtec for migraines and BiPAP for OSA. He notes history of 12 mm left frontal meningioma.     Marlowe Sax, MD is rheumatologist [facility], [place]).   LABS:  She is for labs on arrival. A1c 8.3% 05/01/20.    OTHER: EEG 11/23/19 ([facility]): IMPRESSION: This is an awake normal record, without EEG evidence  for focality or epileptiform discharges. 2 episodes recorded  during photic stimulation with grunting, head turning left/right,  slow to respond, slurred speech, able to remember words during  episode, and waxing/waning of movements were not associated with  abnormal epileptiform activity.  - CLINICAL CORRELATION: This EEG finding did not support the  seizure focus. If clinically indicated follow up video/EEG  monitoring should be considered. A normal EEG does not rule out  epilepsy, which is a clinical diagnosis.  - Spell recorded during EEG was not associated with epileptiform  activity on EEG. No arrhythmia noted during the event either.  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 Simple  partial seizures are not usually detected by scalp EEG but  patient's spell semiology, was not consistent with simple partial  seizure.    IMAGES: MRI Brain 10/20/19: IMPRESSION: No change. No abnormality of the brain parenchyma itself. 11-12 mm inferior left frontal meningioma with dural tail. Slight indentation of the left frontal lobe, but no evidence any brain edema. No mass effect.  It would be unusual for this be a cause of seizure.   EKG: 10/20/19: Sinus rhythm Ventricular premature complex Nonspecific intraventricular conduction  delay Confirmed by UNCONFIRMED, DOCTOR ([id number]), editor Mel Almond, Tammy [phone]) on 10/22/2019 10:02:00 AM   CV: BLE venous US 11/01/19: IMPRESSION: No evidence of deep venous thrombosis in either lower extremity.   Echo 07/27/17 ([facility]): INTERPRETATION  NORMAL LEFT VENTRICULAR SYSTOLIC FUNCTION WITH MILD LVH  NORMAL RIGHT VENTRICULAR SYSTOLIC FUNCTION  MILD VALVULAR REGURGITATION (mild TR)  NO VALVULAR STENOSIS  MILD TR  EF 55%    Nuclear stress test 07/01/17 ([facility]): LVEF= 61 %  FINDINGS:  Regional wall motion:reveals normal myocardial thickening and wall  motion.  The overall quality of the study is good.  Artifacts noted: no  Left ventricular cavity: normal.  - Perfusion Analysis:SPECT images demonstrate homogeneous tracer  distributionthroughout the myocardium.   Cardiac cath 12/31/13 ([place] in [place]): LV: There is increased end-diastolic volume 15 mmHg. Aortic pressure 137/91 mmHg.  Hypokinesis is not present in any region of the LV.  Mitral regurgitation is absent.  The left ventricular ejection fraction is estimated to be 60 to 65%. LM: Normal angiographically and gives rise to the LAD and circumflex. LAD: 30 to 40% distal stenosis of the LAD.  The stent in the mid LAD is patent.  The diagonal branch is normal angiographically. LCX: 30% mid stenosis LCX. AV groove branch and OM1 are normal angiographically. RCA: 40% mid stenosis of the RCA.  PDA and PLB are normal angiographically. - Conclusion: Mild nonobstructive coronary artery disease. - Recommendations: Continue medical therapy with antianginal medication.   Past Medical History:  Diagnosis Date  . Anxiety   . Arthritis    back, knees , neck and feet  . Asthma   . Cancer ([place])    skin - basal cell on nose  . Collagen vascular disease (HCC)    rheumatoid arthritis  . COPD (chronic obstructive pulmonary disease) ([place]) 03/2017   mild  . Coronary artery disease   . Depression    . Diabetes mellitus without complication ([place])   . Endometriosis   . Fibromyalgia   . Gastric ulcer   . GERD (gastroesophageal reflux disease)   . Headache    migraines  . Hyperlipidemia   . Hypertension   . IBS (irritable bowel syndrome)   . Lupus ([place])   . Myocardial infarction ([place]) 08/2009  . Pneumonia   . Sleep apnea    uses cpap  . 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 ABDOMINAL HYSTERECTOMY    . CARDIAC CATHETERIZATION  2010   stent x 1; 12/31/13 LHC ([facility] in [place]): 30-40% dLAD, mid LAD stent patent, 30% mLCX, 40% mRCA. Medical therapy.  . CESAREAN SECTION  2006  . CHOLECYSTECTOMY  2008  . COLONOSCOPY WITH PROPOFOL N/A 01/18/2018   Procedure: COLONOSCOPY WITH PROPOFOL;  Surgeon: Toledo, Benay Pike, MD;  Location: [facility] ENDOSCOPY;  Service: Gastroenterology;  Laterality: N/A;  . DIAGNOSTIC LAPAROSCOPY  04/2000  . DILATION AND CURETTAGE OF UTERUS  04/1997 missed ab  . LAPAROSCOPIC OVARIAN CYSTECTOMY Right 2013  . MULTIPLE TOOTH EXTRACTIONS  1998   4 wisdom teeth  . UTERINE STENT PLACEMENT Bilateral 08/15/2017   Procedure: UTERINE STENT PLACEMENT;  Surgeon: Schermerhorn, Gwen Her, MD;  Location: [facility] ORS;  Service: Gynecology;  Laterality: Bilateral;    MEDICATIONS: No current facility-administered  medications for this encounter.   Marland Kitchen acetaminophen (TYLENOL) 500 MG tablet  . albuterol (PROVENTIL HFA;VENTOLIN HFA) 108 (90 Base) MCG/ACT inhaler  . amitriptyline (ELAVIL) 10 MG tablet  . aspirin EC 81 MG tablet  . atorvastatin (LIPITOR) 80 MG tablet  . azelastine (ASTELIN) 0.1 % nasal spray  . cholecalciferol (VITAMIN D) 25 MCG (1000 UNIT) tablet  . COMBIVENT RESPIMAT 20-100 MCG/ACT AERS respimat  . cyanocobalamin 1000 MCG tablet  . diphenhydrAMINE (BENADRYL) 25 MG tablet  . DULoxetine (CYMBALTA) 60 MG capsule  . EMGALITY 120 MG/ML SOAJ  . ENBREL SURECLICK 50 MG/ML injection  .  FARXIGA 10 MG TABS tablet  . folic acid (FOLVITE) 1 MG tablet  . hydroxychloroquine (PLAQUENIL) 200 MG tablet  . ibuprofen (ADVIL,MOTRIN) 800 MG tablet  . ipratropium-albuterol (DUONEB) 0.5-2.5 (3) MG/3ML SOLN  . levocetirizine (XYZAL) 5 MG tablet  . Methotrexate Sodium (METHOTREXATE, PF,) 200 MG/8ML injection  . metoprolol tartrate (LOPRESSOR) 50 MG tablet  . montelukast (SINGULAIR) 10 MG tablet  . Multiple Vitamins-Minerals (MULTIVITAMIN WITH MINERALS) tablet  . NURTEC 75 MG TBDP  . nystatin (MYCOSTATIN/NYSTOP) powder  . oxyCODONE-acetaminophen (PERCOCET) 10-325 MG tablet  . pantoprazole (PROTONIX) 40 MG tablet  . promethazine (PHENERGAN) 25 MG tablet  . rizatriptan (MAXALT) 10 MG tablet  . traZODone (DESYREL) 100 MG tablet  . metFORMIN (GLUCOPHAGE-XR) 500 MG 24 hr tablet  . NICOTROL 10 MG inhaler    Myra Gianotti, PA-C Surgical Short Stay/Anesthesiology [facility] Phone [phone] [place] Phone [phone] 06/09/2020 2:50 PM

## 2020-06-09 NOTE — Progress Notes (Signed)
Spoke with pt for pre-op call. Pt has hx of CAD with a stent placed in 2009. Dr. Clayborn Bigness is her cardiologist and her last office visit was 08/03/19. She denies any recent chest pain. States that shortness of breath is usually due to her COPD. Pt is a type 2 Diabetic. Last A1C per pt was 8.3 about 2 months ago. She states she checks her blood sugar occasionally. States it is usually around 120-180. Instructed pt to hold her [place] Tuesday and day of surgery. Instructed pt to check her blood sugar when she gets up Wednesday AM and every 2 hours until she leaves for the hospital. If blood sugar is 70 or below, treat with 1/2 cup of clear juice (apple or cranberry) and recheck blood sugar 15 minutes after drinking juice. If blood sugar continues to be 70 or below, call the Short Stay department and ask to speak to a nurse. Pt voiced understanding.   Covid test done today. Pt understands that she stay in quarantine until she comes to the hospital on Wednesday.

## 2020-06-09 NOTE — H&P (Signed)
[name] is an 44 y.o. female.   Chief Complaint: LEFT HAND PAIN AND NUMBNESS  HPI: The patient is a 44y/o right hand dominant female who has had sharp pain, weakness, numbness, tingling, and stiffness of the left hand and wrist for over a decade. She has tried braces, several cortisone injections, activity modification, and medication with no relief. She has had no known injury to this hand. Nerve conduction study was done at [facility] 09/19/18.  She is here today for surgery.  She denies chest pain, shortness of breath, fever, chills, nausea, vomiting, or diarrhea.    Past Medical History:  Diagnosis Date  . Anxiety   . Arthritis    back, knees , neck and feet  . Asthma   . Cancer ([place])    skin  . Collagen vascular disease (HCC)    rheumatoid arthritis  . COPD (chronic obstructive pulmonary disease) ([place]) 03/2017   mild  . Coronary artery disease   . Diabetes mellitus without complication ([place])   . Endometriosis   . Fibromyalgia   . Gastric ulcer   . GERD (gastroesophageal reflux disease)   . Headache    migraines  . Hyperlipidemia   . Hypertension   . IBS (irritable bowel syndrome)   . Lupus ([place])   . Myocardial infarction ([place]) 08/2009  . Sleep apnea    uses bipap  . 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 ABDOMINAL HYSTERECTOMY    . CARDIAC CATHETERIZATION  2010   stent x 1  . CESAREAN SECTION  2006  . CHOLECYSTECTOMY  2008  . COLONOSCOPY WITH PROPOFOL N/A 01/18/2018   Procedure: COLONOSCOPY WITH PROPOFOL;  Surgeon: Toledo, Benay Pike, MD;  Location: [facility] ENDOSCOPY;  Service: Gastroenterology;  Laterality: N/A;  . DIAGNOSTIC LAPAROSCOPY  04/2000  . DILATION AND CURETTAGE OF UTERUS  04/1997 missed ab  . LAPAROSCOPIC OVARIAN CYSTECTOMY Right 2013  . MULTIPLE TOOTH EXTRACTIONS  1998   4 wisdom teeth  . UTERINE STENT PLACEMENT Bilateral 08/15/2017   Procedure: UTERINE STENT PLACEMENT;   Surgeon: Schermerhorn, Gwen Her, MD;  Location: [facility] ORS;  Service: Gynecology;  Laterality: Bilateral;    No family history on file. Social History:  reports that she has been smoking cigarettes. She has been smoking about 1.00 pack per day. She has never used smokeless tobacco. She reports that she does not drink alcohol and does not use drugs.  Allergies:  Allergies  Allergen Reactions  . Glimepiride Hives  . Avelox [Moxifloxacin Hcl In Nacl] Nausea And Vomiting  . Levaquin [Levofloxacin In D5w]     Makes me achey all over  . Penicillins Hives    Has patient had a PCN reaction causing immediate rash, facial/tongue/throat swelling, SOB or lightheadedness with hypotension: Yes Has patient had a PCN reaction causing severe rash involving mucus membranes or skin necrosis: Unknown Has patient had a PCN reaction that required hospitalization: No Has patient had a PCN reaction occurring within the last 10 years: No If all of the above answers are "NO", then may proceed with Cephalosporin use.   . Sulfa Antibiotics Hives  . Gabapentin Swelling    Pt states her face and eyes swelled up.   [name] [Nitrofurantoin] Rash    In mouth and pelvic area  . Tetracyclines & Related Rash    In mouth and pelvic area    No medications prior to admission.  No results found for this or any previous visit (from the past 48 hour(s)). No results found.  ROS NO RECENT ILLNESSES OR HOSPITALIZATIONS  Last menstrual period 07/21/2017. Physical Exam  General Appearance:  Alert, cooperative, no distress, appears stated age  Head:  Normocephalic, without obvious abnormality, atraumatic  Eyes:  Pupils equal, conjunctiva/corneas clear,         Throat: Lips, mucosa, and tongue normal; teeth and gums normal  Neck: No visible masses     Lungs:   respirations unlabored  Chest Wall:  No tenderness or deformity  Heart:  Regular rate and rhythm,  Abdomen:   Soft, non-tender,         Extremities:  LUE - NO SIGNIFICANT ERYTHEMA, OPEN WOUNDS, OR SWELLING. CAPILLARY REFILL LESS THAN 2 SECONDS. TENDERNESS TO PALPATION OVER THE CARPAL CANAL. ABLE TO MAKE A FULL FIST, CROSS FINGERS, AND ABDUCT THUMB. POSITIVE PHALANS TEST.  Pulses: 2+ and symmetric  Skin: Skin color, texture, turgor normal, no rashes or lesions     Neurologic: Normal    Assessment/Plan LEFT CARPAL TUNNEL SYNDROME    - LEFT CARPAL TUNNEL RELEASE   WE ARE PLANNING SURGERY FOR YOUR UPPER EXTREMITY. THE RISKS AND BENEFITS OF SURGERY INCLUDE BUT NOT LIMITED TO BLEEDING INFECTION, DAMAGE TO NEARBY NERVES ARTERIES TENDONS, FAILURE OF SURGERY TO ACCOMPLISH ITS INTENDED GOALS, PERSISTENT SYMPTOMS AND NEED FOR FURTHER SURGICAL INTERVENTION. WITH THIS IN MIND WE WILL PROCEED. I HAVE DISCUSSED WITH THE PATIENT THE PRE AND POSTOPERATIVE REGIMEN AND THE DOS AND DON'TS. PT VOICED UNDERSTANDING AND INFORMED CONSENT SIGNED.  R/B/A DISCUSSED WITH PT IN OFFICE.  PT VOICED UNDERSTANDING OF PLAN CONSENT SIGNED DAY OF SURGERY PT SEEN AND EXAMINED PRIOR TO OPERATIVE PROCEDURE/DAY OF SURGERY SITE MARKED. QUESTIONS ANSWERED WILL GO HOME FOLLOWING SURGERY  [name] [facility] MD    Brynda Peon 06/09/2020, 1:12 PM

## 2020-06-10 MED ORDER — VANCOMYCIN HCL 1500 MG/300ML IV SOLN
1500.0000 mg | INTRAVENOUS | Status: DC
  Filled 2020-06-10 (×2): qty 300

## 2020-06-11 ENCOUNTER — Encounter (HOSPITAL_COMMUNITY): Payer: Self-pay | Admitting: Orthopedic Surgery

## 2020-06-11 ENCOUNTER — Ambulatory Visit (HOSPITAL_COMMUNITY): Payer: Medicare HMO | Admitting: Vascular Surgery

## 2020-06-11 ENCOUNTER — Encounter (HOSPITAL_COMMUNITY)
Admission: RE | Disposition: A | Discharge status: Home / Self Care | Payer: Self-pay | Source: Home / Self Care | Attending: Orthopedic Surgery

## 2020-06-11 ENCOUNTER — Other Ambulatory Visit: Payer: Self-pay

## 2020-06-11 ENCOUNTER — Ambulatory Visit (HOSPITAL_COMMUNITY)
Admission: RE | Admit: 2020-06-11 | Discharge: 2020-06-11 | Disposition: A | Discharge status: Home / Self Care | Payer: Medicare HMO | Attending: Orthopedic Surgery | Admitting: Orthopedic Surgery

## 2020-06-11 DIAGNOSIS — G5602 Carpal tunnel syndrome, left upper limb: Secondary | ICD-10-CM | POA: Diagnosis not present

## 2020-06-11 DIAGNOSIS — E669 Obesity, unspecified: Secondary | ICD-10-CM | POA: Diagnosis not present

## 2020-06-11 DIAGNOSIS — R519 Headache, unspecified: Secondary | ICD-10-CM | POA: Diagnosis not present

## 2020-06-11 DIAGNOSIS — M069 Rheumatoid arthritis, unspecified: Secondary | ICD-10-CM | POA: Insufficient documentation

## 2020-06-11 DIAGNOSIS — Z888 Allergy status to other drugs, medicaments and biological substances status: Secondary | ICD-10-CM | POA: Diagnosis not present

## 2020-06-11 DIAGNOSIS — K589 Irritable bowel syndrome without diarrhea: Secondary | ICD-10-CM | POA: Insufficient documentation

## 2020-06-11 DIAGNOSIS — Z9049 Acquired absence of other specified parts of digestive tract: Secondary | ICD-10-CM | POA: Diagnosis not present

## 2020-06-11 DIAGNOSIS — M797 Fibromyalgia: Secondary | ICD-10-CM | POA: Insufficient documentation

## 2020-06-11 DIAGNOSIS — Z881 Allergy status to other antibiotic agents status: Secondary | ICD-10-CM | POA: Insufficient documentation

## 2020-06-11 DIAGNOSIS — I2510 Atherosclerotic heart disease of native coronary artery without angina pectoris: Secondary | ICD-10-CM | POA: Diagnosis not present

## 2020-06-11 DIAGNOSIS — I10 Essential (primary) hypertension: Secondary | ICD-10-CM | POA: Diagnosis not present

## 2020-06-11 DIAGNOSIS — F17200 Nicotine dependence, unspecified, uncomplicated: Secondary | ICD-10-CM | POA: Insufficient documentation

## 2020-06-11 DIAGNOSIS — G250 Essential tremor: Secondary | ICD-10-CM | POA: Insufficient documentation

## 2020-06-11 DIAGNOSIS — Z6841 Body Mass Index (BMI) 40.0 and over, adult: Secondary | ICD-10-CM | POA: Insufficient documentation

## 2020-06-11 DIAGNOSIS — J449 Chronic obstructive pulmonary disease, unspecified: Secondary | ICD-10-CM | POA: Insufficient documentation

## 2020-06-11 DIAGNOSIS — K219 Gastro-esophageal reflux disease without esophagitis: Secondary | ICD-10-CM | POA: Diagnosis not present

## 2020-06-11 DIAGNOSIS — I252 Old myocardial infarction: Secondary | ICD-10-CM | POA: Diagnosis not present

## 2020-06-11 DIAGNOSIS — K279 Peptic ulcer, site unspecified, unspecified as acute or chronic, without hemorrhage or perforation: Secondary | ICD-10-CM | POA: Insufficient documentation

## 2020-06-11 DIAGNOSIS — Z90710 Acquired absence of both cervix and uterus: Secondary | ICD-10-CM | POA: Insufficient documentation

## 2020-06-11 DIAGNOSIS — Z880 Allergy status to penicillin: Secondary | ICD-10-CM | POA: Diagnosis not present

## 2020-06-11 DIAGNOSIS — G4730 Sleep apnea, unspecified: Secondary | ICD-10-CM | POA: Insufficient documentation

## 2020-06-11 DIAGNOSIS — M329 Systemic lupus erythematosus, unspecified: Secondary | ICD-10-CM | POA: Diagnosis not present

## 2020-06-11 DIAGNOSIS — E119 Type 2 diabetes mellitus without complications: Secondary | ICD-10-CM | POA: Diagnosis not present

## 2020-06-11 DIAGNOSIS — R69 Illness, unspecified: Secondary | ICD-10-CM | POA: Diagnosis not present

## 2020-06-11 DIAGNOSIS — Z882 Allergy status to sulfonamides status: Secondary | ICD-10-CM | POA: Insufficient documentation

## 2020-06-11 HISTORY — PX: CARPAL TUNNEL RELEASE: SHX101

## 2020-06-11 HISTORY — DX: Pneumonia, unspecified organism: J18.9

## 2020-06-11 HISTORY — DX: Depression, unspecified: F32.A

## 2020-06-11 LAB — BASIC METABOLIC PANEL
Anion gap: 7 (ref 5–15)
BUN: 10 mg/dL (ref 6–20)
CO2: 23 mmol/L (ref 22–32)
Calcium: 9.1 mg/dL (ref 8.9–10.3)
Chloride: 107 mmol/L (ref 98–111)
Creatinine, Ser: 0.71 mg/dL (ref 0.44–1.00)
GFR calc Af Amer: >60 mL/min (ref >60)
GFR calc non Af Amer: >60 mL/min (ref >60)
Glucose, Bld: 157 mg/dL — ABNORMAL HIGH (ref 70–99)
Potassium: 3.6 mmol/L (ref 3.5–5.1)
Sodium: 137 mmol/L (ref 135–145)

## 2020-06-11 LAB — CBC
HCT: 46.2 % — ABNORMAL HIGH (ref 36.0–46.0)
Hemoglobin: 15.2 g/dL — ABNORMAL HIGH (ref 12.0–15.0)
MCH: 33.2 pg (ref 26.0–34.0)
MCHC: 32.9 g/dL (ref 30.0–36.0)
MCV: 100.9 fL — ABNORMAL HIGH (ref 80.0–100.0)
Platelets: 246 10*3/uL (ref 150–400)
RBC: 4.58 MIL/uL (ref 3.87–5.11)
RDW: 13.1 % (ref 11.5–15.5)
WBC: 13.4 10*3/uL — ABNORMAL HIGH (ref 4.0–10.5)
nRBC: 0 % (ref 0.0–0.2)

## 2020-06-11 LAB — GLUCOSE, CAPILLARY
Glucose-Capillary: 164 mg/dL — ABNORMAL HIGH (ref 70–99)
Glucose-Capillary: 103 mg/dL — ABNORMAL HIGH (ref 70–99)

## 2020-06-11 SURGERY — CARPAL TUNNEL RELEASE
Anesthesia: Monitor Anesthesia Care | Laterality: Left

## 2020-06-11 MED ORDER — CHLORHEXIDINE GLUCONATE 0.12 % MT SOLN: 15.0000 mL | Freq: Once | OROMUCOSAL | Status: DC

## 2020-06-11 MED ORDER — OXYCODONE HCL 5 MG PO TABS: 5.0000 mg | ORAL_TABLET | Freq: Once | ORAL | Status: DC | PRN

## 2020-06-11 MED ORDER — ONDANSETRON HCL 4 MG/2ML IJ SOLN: 4.0000 mg | Freq: Once | INTRAMUSCULAR | Status: DC | PRN

## 2020-06-11 MED ORDER — BUPIVACAINE HCL (PF) 0.25 % IJ SOLN
INTRAMUSCULAR | Status: AC
  Filled 2020-06-11: qty 30

## 2020-06-11 MED ORDER — PROPOFOL 1000 MG/100ML IV EMUL
INTRAVENOUS | Status: AC
  Filled 2020-06-11: qty 100

## 2020-06-11 MED ORDER — FENTANYL CITRATE (PF) 250 MCG/5ML IJ SOLN
INTRAMUSCULAR | Status: AC
  Filled 2020-06-11: qty 5

## 2020-06-11 MED ORDER — ORAL CARE MOUTH RINSE: 15.0000 mL | Freq: Once | OROMUCOSAL | Status: DC

## 2020-06-11 MED ORDER — OXYCODONE HCL 5 MG/5ML PO SOLN: 5.0000 mg | Freq: Once | ORAL | Status: DC | PRN

## 2020-06-11 MED ORDER — LIDOCAINE HCL 1 % IJ SOLN
INTRAMUSCULAR | Status: AC
  Filled 2020-06-11: qty 20

## 2020-06-11 MED ORDER — FENTANYL CITRATE (PF) 100 MCG/2ML IJ SOLN
INTRAMUSCULAR | Status: DC | PRN
Start: 2020-06-11 — End: 2020-06-11
  Administered 2020-06-11: 50 ug via INTRAVENOUS

## 2020-06-11 MED ORDER — FENTANYL CITRATE (PF) 100 MCG/2ML IJ SOLN: 25.0000 ug | INTRAMUSCULAR | Status: DC | PRN

## 2020-06-11 MED ORDER — MIDAZOLAM HCL 5 MG/5ML IJ SOLN
INTRAMUSCULAR | Status: DC | PRN
  Administered 2020-06-11: 2 mg via INTRAVENOUS

## 2020-06-11 MED ORDER — PROPOFOL 10 MG/ML IV BOLUS
INTRAVENOUS | Status: DC | PRN
  Administered 2020-06-11 (×2): 50 mg via INTRAVENOUS

## 2020-06-11 MED ORDER — LACTATED RINGERS IV SOLN: INTRAVENOUS | Status: DC

## 2020-06-11 MED ORDER — MIDAZOLAM HCL 2 MG/2ML IJ SOLN
INTRAMUSCULAR | Status: AC
  Filled 2020-06-11: qty 2

## 2020-06-11 MED ORDER — LIDOCAINE HCL 1 % IJ SOLN
INTRAMUSCULAR | Status: DC | PRN
  Administered 2020-06-11: 20 mL

## 2020-06-11 MED ORDER — PROPOFOL 500 MG/50ML IV EMUL
INTRAVENOUS | Status: DC | PRN
  Administered 2020-06-11: 100 ug/kg/min via INTRAVENOUS

## 2020-06-11 SURGICAL SUPPLY — 46 items
BNDG ELASTIC 3X5.8 VLCR STR LF (GAUZE/BANDAGES/DRESSINGS) ×2 IMPLANT
BNDG ELASTIC 4X5.8 VLCR STR LF (GAUZE/BANDAGES/DRESSINGS) ×2 IMPLANT
BNDG ESMARK 4X9 LF (GAUZE/BANDAGES/DRESSINGS) ×2 IMPLANT
BNDG GAUZE ELAST 4 BULKY (GAUZE/BANDAGES/DRESSINGS) ×2 IMPLANT
CORD BIPOLAR FORCEPS 12FT (ELECTRODE) ×2 IMPLANT
COVER SURGICAL LIGHT HANDLE (MISCELLANEOUS) ×2 IMPLANT
COVER WAND RF STERILE (DRAPES) ×2 IMPLANT
CUFF TOURN SGL QUICK 18X4 (TOURNIQUET CUFF) ×2 IMPLANT
CUFF TOURN SGL QUICK 24 (TOURNIQUET CUFF)
CUFF TRNQT CYL 24X4X16.5-23 (TOURNIQUET CUFF) IMPLANT
DRAPE SURG 17X23 STRL (DRAPES) ×2 IMPLANT
EVACUATOR 1/8 PVC DRAIN (DRAIN) IMPLANT
GAUZE SPONGE 4X4 12PLY STRL (GAUZE/BANDAGES/DRESSINGS) ×2 IMPLANT
GAUZE XEROFORM 1X8 LF (GAUZE/BANDAGES/DRESSINGS) ×2 IMPLANT
GLOVE BIOGEL PI IND STRL 8.5 (GLOVE) ×1 IMPLANT
GLOVE BIOGEL PI INDICATOR 8.5 (GLOVE) ×1
GLOVE SURG ORTHO 8.0 STRL STRW (GLOVE) ×2 IMPLANT
GOWN STRL REUS W/ TWL LRG LVL3 (GOWN DISPOSABLE) ×2 IMPLANT
GOWN STRL REUS W/ TWL XL LVL3 (GOWN DISPOSABLE) ×1 IMPLANT
GOWN STRL REUS W/TWL LRG LVL3 (GOWN DISPOSABLE) ×2
GOWN STRL REUS W/TWL XL LVL3 (GOWN DISPOSABLE) ×1
KIT BASIN OR (CUSTOM PROCEDURE TRAY) ×2 IMPLANT
KIT TURNOVER KIT B (KITS) ×2 IMPLANT
LOOP VESSEL MAXI BLUE (MISCELLANEOUS) IMPLANT
NEEDLE HYPO 25GX1X1/2 BEV (NEEDLE) IMPLANT
NS IRRIG 1000ML POUR BTL (IV SOLUTION) ×2 IMPLANT
PACK ORTHO EXTREMITY (CUSTOM PROCEDURE TRAY) ×2 IMPLANT
PAD ARMBOARD 7.5X6 YLW CONV (MISCELLANEOUS) ×4 IMPLANT
PAD CAST 3X4 CTTN HI CHSV (CAST SUPPLIES) ×1 IMPLANT
PAD CAST 4YDX4 CTTN HI CHSV (CAST SUPPLIES) ×2 IMPLANT
PADDING CAST COTTON 3X4 STRL (CAST SUPPLIES) ×1
PADDING CAST COTTON 4X4 STRL (CAST SUPPLIES) ×2
SOAP 2 % CHG 4 OZ (WOUND CARE) ×2 IMPLANT
SUCTION FRAZIER HANDLE 10FR (MISCELLANEOUS)
SUCTION TUBE FRAZIER 10FR DISP (MISCELLANEOUS) IMPLANT
SUT PROLENE 4 0 PS 2 18 (SUTURE) IMPLANT
SUT VIC AB 2-0 CT1 27 (SUTURE)
SUT VIC AB 2-0 CT1 TAPERPNT 27 (SUTURE) IMPLANT
SUT VIC AB 3-0 FS2 27 (SUTURE) IMPLANT
SYR CONTROL 10ML LL (SYRINGE) IMPLANT
SYSTEM CHEST DRAIN TLS 7FR (DRAIN) IMPLANT
TOWEL GREEN STERILE (TOWEL DISPOSABLE) ×2 IMPLANT
TOWEL GREEN STERILE FF (TOWEL DISPOSABLE) ×2 IMPLANT
TUBE CONNECTING 12X1/4 (SUCTIONS) IMPLANT
UNDERPAD 30X36 HEAVY ABSORB (UNDERPADS AND DIAPERS) ×2 IMPLANT
WATER STERILE IRR 1000ML POUR (IV SOLUTION) ×2 IMPLANT

## 2020-06-11 NOTE — Discharge Instructions (Signed)
Keep Bandage clean and dry until follow up appointment.  Follow Up with Dr. Caralyn Guile in 2 weeks. Call the office to make this appointment: [phone] Continue to take prescribed medication--the system indicates that you have 1 week of pain medication left so we cannot prescribe more at this time. Watch for signs of infection: redness, tenderness, fever, discharge. Call the office with concerns. Call 911 or come to the ER for anything emergent or life threatening.

## 2020-06-11 NOTE — Transfer of Care (Signed)
Immediate Anesthesia Transfer of Care Note  Patient: [name]  Procedure(s) Performed: CARPAL TUNNEL RELEASE (Left )  Patient Location: PACU  Anesthesia Type:MAC  Level of Consciousness: awake, alert  and oriented  Airway & Oxygen Therapy: Patient Spontanous Breathing  Post-op Assessment: Report given to RN and Post -op Vital signs reviewed and stable  Post vital signs: Reviewed and stable  Last Vitals:  Vitals Value Taken Time  BP 151/72 06/11/20 1717  Temp 37.2 C 06/11/20 1715  Pulse 83 06/11/20 1718  Resp 20 06/11/20 1718  SpO2 95 % 06/11/20 1718  Vitals shown include unvalidated device data.  Last Pain:  Vitals:   06/11/20 1419  TempSrc:   PainSc: 9       Patients Stated Pain Goal: 3 (03/06/75 2831)  Complications: No complications documented.

## 2020-06-11 NOTE — Anesthesia Postprocedure Evaluation (Signed)
Anesthesia Post Note  Patient: [name]  Procedure(s) Performed: CARPAL TUNNEL RELEASE (Left )     Patient location during evaluation: PACU Anesthesia Type: MAC Level of consciousness: awake and alert and oriented Pain management: pain level controlled Vital Signs Assessment: post-procedure vital signs reviewed and stable Respiratory status: spontaneous breathing, nonlabored ventilation and respiratory function stable Cardiovascular status: stable and blood pressure returned to baseline Postop Assessment: no apparent nausea or vomiting Anesthetic complications: no   No complications documented.  Last Vitals:  Vitals:   06/11/20 1800 06/11/20 1805  BP: 128/83   Pulse: 83   Resp: 20   Temp:  36.7 C  SpO2: 94%     Last Pain:  Vitals:   06/11/20 1745  TempSrc:   PainSc: 0-No pain                 [name] A.

## 2020-06-11 NOTE — Op Note (Signed)
PREOPERATIVE DIAGNOSIS:Left hand carpal tunnel syndrome  POSTOPERATIVE DIAGNOSIS:Left hand carpal tunnel syndrome  ATTENDING SURGEON: Dr. Iran Planas who scrubbed and present for the entire procedure  ASSISTANT SURGEON: Gertie Fey, PA-C was necessary for nerve release closure and dressing application in a timely fashion  ANESTHESIA: 1% Xylocaine quarter percent Marcaine local block with IV sedation  OPERATIVE PROCEDURE: Left hand carpal tunnel release  IMPLANTS: None  RADIOGRAPHIC INTERPRETATION: None  SURGICAL INDICATIONS: Patient is a right-hand-dominant female with persistent numbness and tingling in the left hand.  Patient elected undergo the above procedure.  The risks of surgery include but not limited to bleeding infection damage nearby nerves arteries or tendons loss of motion of the wrist and digits incomplete relief of symptoms and need for further surgical invention.  SURGICAL TECHNIQUE: The patient was brought identified in the preoperative holding area marked with a permanent marker made on the left hand indicate the correct operative site.  Patient then brought back the operating placed supine on anesthesia table where the IV sedation was administered.  Local anesthetic was administered.  Patient tolerated this well.  Well-padded tourniquet placed on the left forearm and seal with the appropriate drape.  Left upper extremities then prepped and draped normal sterile fashion.  A timeout was called the correct site was identified the procedure then begun.  The local anesthetic had been administered.  Dissection carried down to the skin and subcutaneous tissue after the tourniquet had been insufflated.  The palmar fascia was incised longitudinally.  Direct exposure of the transverse carpal ligament was then done and released under direct visualization.  This was done with a 15 blade.  Further exposure was then carried out proximally for the remaining portion of the transverse  carpal ligament as well as portion of the antebrachial fascia was then released.  Protection of the median nerve was done throughout.  The contents of the carpal canal were then inspected and no other abnormalities noted. The wound was thoroughly irrigated.  Skin was then closed using horizontal mattress Prolene sutures. A sterile compressive bandage then applied.  Patient tolerated the procedure well was taken recovery room in good condition.  POSTOPERATIVE PLAN: Patient be discharged to home.  See him back in the office in 10 to 14 days for wound check suture removal transition into a gel flex padded glove lifting restrictions for the first 6 weeks

## 2020-06-12 ENCOUNTER — Encounter (HOSPITAL_COMMUNITY): Payer: Self-pay | Admitting: Orthopedic Surgery

## 2020-06-14 DIAGNOSIS — G4733 Obstructive sleep apnea (adult) (pediatric): Secondary | ICD-10-CM | POA: Diagnosis not present

## 2020-06-16 DIAGNOSIS — E785 Hyperlipidemia, unspecified: Secondary | ICD-10-CM | POA: Diagnosis not present

## 2020-06-16 DIAGNOSIS — E1142 Type 2 diabetes mellitus with diabetic polyneuropathy: Secondary | ICD-10-CM | POA: Diagnosis not present

## 2020-06-16 DIAGNOSIS — E11620 Type 2 diabetes mellitus with diabetic dermatitis: Secondary | ICD-10-CM | POA: Diagnosis not present

## 2020-06-16 DIAGNOSIS — R69 Illness, unspecified: Secondary | ICD-10-CM | POA: Diagnosis not present

## 2020-06-16 DIAGNOSIS — G43909 Migraine, unspecified, not intractable, without status migrainosus: Secondary | ICD-10-CM | POA: Diagnosis not present

## 2020-06-16 DIAGNOSIS — J449 Chronic obstructive pulmonary disease, unspecified: Secondary | ICD-10-CM | POA: Diagnosis not present

## 2020-06-16 DIAGNOSIS — M0550 Rheumatoid polyneuropathy with rheumatoid arthritis of unspecified site: Secondary | ICD-10-CM | POA: Diagnosis not present

## 2020-06-16 DIAGNOSIS — Z6841 Body Mass Index (BMI) 40.0 and over, adult: Secondary | ICD-10-CM | POA: Diagnosis not present

## 2020-06-16 DIAGNOSIS — G4700 Insomnia, unspecified: Secondary | ICD-10-CM | POA: Diagnosis not present

## 2020-06-23 DIAGNOSIS — E049 Nontoxic goiter, unspecified: Secondary | ICD-10-CM | POA: Diagnosis not present

## 2020-06-24 DIAGNOSIS — Z4789 Encounter for other orthopedic aftercare: Secondary | ICD-10-CM | POA: Diagnosis not present

## 2020-06-24 DIAGNOSIS — G5601 Carpal tunnel syndrome, right upper limb: Secondary | ICD-10-CM | POA: Diagnosis not present

## 2020-06-24 DIAGNOSIS — G5602 Carpal tunnel syndrome, left upper limb: Secondary | ICD-10-CM | POA: Diagnosis not present

## 2020-06-30 ENCOUNTER — Other Ambulatory Visit: Payer: Self-pay

## 2020-06-30 ENCOUNTER — Ambulatory Visit
Admission: RE | Admit: 2020-06-30 | Discharge: 2020-06-30 | Disposition: A | Discharge status: Home / Self Care | Payer: Medicare HMO | Source: Ambulatory Visit | Attending: Obstetrics & Gynecology | Admitting: Obstetrics & Gynecology

## 2020-06-30 DIAGNOSIS — H25041 Posterior subcapsular polar age-related cataract, right eye: Secondary | ICD-10-CM | POA: Diagnosis not present

## 2020-06-30 DIAGNOSIS — Z01818 Encounter for other preprocedural examination: Secondary | ICD-10-CM | POA: Diagnosis not present

## 2020-06-30 DIAGNOSIS — Z1231 Encounter for screening mammogram for malignant neoplasm of breast: Secondary | ICD-10-CM | POA: Diagnosis not present

## 2020-07-08 DIAGNOSIS — G4733 Obstructive sleep apnea (adult) (pediatric): Secondary | ICD-10-CM | POA: Diagnosis not present

## 2020-07-14 DIAGNOSIS — G4733 Obstructive sleep apnea (adult) (pediatric): Secondary | ICD-10-CM | POA: Diagnosis not present

## 2020-07-16 DIAGNOSIS — E042 Nontoxic multinodular goiter: Secondary | ICD-10-CM | POA: Diagnosis not present

## 2020-07-17 DIAGNOSIS — M0609 Rheumatoid arthritis without rheumatoid factor, multiple sites: Secondary | ICD-10-CM | POA: Diagnosis not present

## 2020-07-17 DIAGNOSIS — G43009 Migraine without aura, not intractable, without status migrainosus: Secondary | ICD-10-CM | POA: Diagnosis not present

## 2020-07-17 DIAGNOSIS — G629 Polyneuropathy, unspecified: Secondary | ICD-10-CM | POA: Diagnosis not present

## 2020-07-17 DIAGNOSIS — J449 Chronic obstructive pulmonary disease, unspecified: Secondary | ICD-10-CM | POA: Diagnosis not present

## 2020-07-17 DIAGNOSIS — R69 Illness, unspecified: Secondary | ICD-10-CM | POA: Diagnosis not present

## 2020-07-17 DIAGNOSIS — K582 Mixed irritable bowel syndrome: Secondary | ICD-10-CM | POA: Diagnosis not present

## 2020-07-17 DIAGNOSIS — G4733 Obstructive sleep apnea (adult) (pediatric): Secondary | ICD-10-CM | POA: Diagnosis not present

## 2020-07-17 DIAGNOSIS — I25119 Atherosclerotic heart disease of native coronary artery with unspecified angina pectoris: Secondary | ICD-10-CM | POA: Diagnosis not present

## 2020-07-17 DIAGNOSIS — E118 Type 2 diabetes mellitus with unspecified complications: Secondary | ICD-10-CM | POA: Diagnosis not present

## 2020-07-17 DIAGNOSIS — Z7689 Persons encountering health services in other specified circumstances: Secondary | ICD-10-CM | POA: Diagnosis not present

## 2020-07-17 DIAGNOSIS — M170 Bilateral primary osteoarthritis of knee: Secondary | ICD-10-CM | POA: Diagnosis not present

## 2020-07-18 DIAGNOSIS — H2511 Age-related nuclear cataract, right eye: Secondary | ICD-10-CM | POA: Diagnosis not present

## 2020-07-18 DIAGNOSIS — H25041 Posterior subcapsular polar age-related cataract, right eye: Secondary | ICD-10-CM | POA: Diagnosis not present

## 2020-07-24 DIAGNOSIS — I10 Essential (primary) hypertension: Secondary | ICD-10-CM | POA: Diagnosis not present

## 2020-07-24 DIAGNOSIS — I208 Other forms of angina pectoris: Secondary | ICD-10-CM | POA: Diagnosis not present

## 2020-07-24 DIAGNOSIS — R0602 Shortness of breath: Secondary | ICD-10-CM | POA: Diagnosis not present

## 2020-07-24 DIAGNOSIS — G4733 Obstructive sleep apnea (adult) (pediatric): Secondary | ICD-10-CM | POA: Diagnosis not present

## 2020-07-24 DIAGNOSIS — L930 Discoid lupus erythematosus: Secondary | ICD-10-CM | POA: Diagnosis not present

## 2020-07-24 DIAGNOSIS — R519 Headache, unspecified: Secondary | ICD-10-CM | POA: Diagnosis not present

## 2020-07-24 DIAGNOSIS — I2510 Atherosclerotic heart disease of native coronary artery without angina pectoris: Secondary | ICD-10-CM | POA: Diagnosis not present

## 2020-07-24 DIAGNOSIS — Z955 Presence of coronary angioplasty implant and graft: Secondary | ICD-10-CM | POA: Diagnosis not present

## 2020-07-24 DIAGNOSIS — R42 Dizziness and giddiness: Secondary | ICD-10-CM | POA: Diagnosis not present

## 2020-07-24 DIAGNOSIS — J449 Chronic obstructive pulmonary disease, unspecified: Secondary | ICD-10-CM | POA: Diagnosis not present

## 2020-07-29 DIAGNOSIS — M7122 Synovial cyst of popliteal space [Baker], left knee: Secondary | ICD-10-CM | POA: Diagnosis not present

## 2020-07-29 DIAGNOSIS — Z6841 Body Mass Index (BMI) 40.0 and over, adult: Secondary | ICD-10-CM | POA: Diagnosis not present

## 2020-07-29 DIAGNOSIS — M170 Bilateral primary osteoarthritis of knee: Secondary | ICD-10-CM | POA: Diagnosis not present

## 2020-07-29 DIAGNOSIS — M25562 Pain in left knee: Secondary | ICD-10-CM | POA: Diagnosis not present

## 2020-07-29 DIAGNOSIS — G8929 Other chronic pain: Secondary | ICD-10-CM | POA: Diagnosis not present

## 2020-07-29 DIAGNOSIS — M25561 Pain in right knee: Secondary | ICD-10-CM | POA: Diagnosis not present

## 2020-08-01 ENCOUNTER — Other Ambulatory Visit: Payer: Self-pay | Admitting: Neurosurgery

## 2020-08-01 DIAGNOSIS — D329 Benign neoplasm of meninges, unspecified: Secondary | ICD-10-CM

## 2020-08-04 DIAGNOSIS — R21 Rash and other nonspecific skin eruption: Secondary | ICD-10-CM | POA: Diagnosis not present

## 2020-08-04 DIAGNOSIS — M25511 Pain in right shoulder: Secondary | ICD-10-CM | POA: Diagnosis not present

## 2020-08-04 DIAGNOSIS — M79673 Pain in unspecified foot: Secondary | ICD-10-CM | POA: Diagnosis not present

## 2020-08-04 DIAGNOSIS — Z79899 Other long term (current) drug therapy: Secondary | ICD-10-CM | POA: Diagnosis not present

## 2020-08-04 DIAGNOSIS — M1990 Unspecified osteoarthritis, unspecified site: Secondary | ICD-10-CM | POA: Diagnosis not present

## 2020-08-04 DIAGNOSIS — M7581 Other shoulder lesions, right shoulder: Secondary | ICD-10-CM | POA: Diagnosis not present

## 2020-08-04 DIAGNOSIS — M0609 Rheumatoid arthritis without rheumatoid factor, multiple sites: Secondary | ICD-10-CM | POA: Diagnosis not present

## 2020-08-04 DIAGNOSIS — G5600 Carpal tunnel syndrome, unspecified upper limb: Secondary | ICD-10-CM | POA: Diagnosis not present

## 2020-08-04 DIAGNOSIS — M797 Fibromyalgia: Secondary | ICD-10-CM | POA: Diagnosis not present

## 2020-08-04 DIAGNOSIS — M79643 Pain in unspecified hand: Secondary | ICD-10-CM | POA: Diagnosis not present

## 2020-08-05 DIAGNOSIS — M0609 Rheumatoid arthritis without rheumatoid factor, multiple sites: Secondary | ICD-10-CM | POA: Diagnosis not present

## 2020-08-05 DIAGNOSIS — E118 Type 2 diabetes mellitus with unspecified complications: Secondary | ICD-10-CM | POA: Diagnosis not present

## 2020-08-05 DIAGNOSIS — I25119 Atherosclerotic heart disease of native coronary artery with unspecified angina pectoris: Secondary | ICD-10-CM | POA: Diagnosis not present

## 2020-08-12 DIAGNOSIS — M542 Cervicalgia: Secondary | ICD-10-CM | POA: Diagnosis not present

## 2020-08-12 DIAGNOSIS — M47812 Spondylosis without myelopathy or radiculopathy, cervical region: Secondary | ICD-10-CM | POA: Diagnosis not present

## 2020-08-12 DIAGNOSIS — M4692 Unspecified inflammatory spondylopathy, cervical region: Secondary | ICD-10-CM | POA: Diagnosis not present

## 2020-08-12 DIAGNOSIS — J449 Chronic obstructive pulmonary disease, unspecified: Secondary | ICD-10-CM | POA: Diagnosis not present

## 2020-08-12 DIAGNOSIS — I700 Atherosclerosis of aorta: Secondary | ICD-10-CM | POA: Diagnosis not present

## 2020-08-12 DIAGNOSIS — M25562 Pain in left knee: Secondary | ICD-10-CM | POA: Diagnosis not present

## 2020-08-12 DIAGNOSIS — M0609 Rheumatoid arthritis without rheumatoid factor, multiple sites: Secondary | ICD-10-CM | POA: Diagnosis not present

## 2020-08-12 DIAGNOSIS — E118 Type 2 diabetes mellitus with unspecified complications: Secondary | ICD-10-CM | POA: Diagnosis not present

## 2020-08-12 DIAGNOSIS — I25119 Atherosclerotic heart disease of native coronary artery with unspecified angina pectoris: Secondary | ICD-10-CM | POA: Diagnosis not present

## 2020-08-12 DIAGNOSIS — G4733 Obstructive sleep apnea (adult) (pediatric): Secondary | ICD-10-CM | POA: Diagnosis not present

## 2020-08-13 DIAGNOSIS — G4733 Obstructive sleep apnea (adult) (pediatric): Secondary | ICD-10-CM | POA: Diagnosis not present

## 2020-08-14 DIAGNOSIS — M542 Cervicalgia: Secondary | ICD-10-CM | POA: Diagnosis not present

## 2020-08-14 DIAGNOSIS — M1712 Unilateral primary osteoarthritis, left knee: Secondary | ICD-10-CM | POA: Diagnosis not present

## 2020-08-14 DIAGNOSIS — M25562 Pain in left knee: Secondary | ICD-10-CM | POA: Diagnosis not present

## 2020-08-14 DIAGNOSIS — M62838 Other muscle spasm: Secondary | ICD-10-CM | POA: Diagnosis not present

## 2020-08-18 DIAGNOSIS — M25561 Pain in right knee: Secondary | ICD-10-CM | POA: Diagnosis not present

## 2020-08-18 DIAGNOSIS — M542 Cervicalgia: Secondary | ICD-10-CM | POA: Diagnosis not present

## 2020-08-18 DIAGNOSIS — M5416 Radiculopathy, lumbar region: Secondary | ICD-10-CM | POA: Diagnosis not present

## 2020-08-20 DIAGNOSIS — J449 Chronic obstructive pulmonary disease, unspecified: Secondary | ICD-10-CM | POA: Diagnosis not present

## 2020-08-21 DIAGNOSIS — G5601 Carpal tunnel syndrome, right upper limb: Secondary | ICD-10-CM | POA: Diagnosis not present

## 2020-08-21 DIAGNOSIS — E042 Nontoxic multinodular goiter: Secondary | ICD-10-CM | POA: Diagnosis not present

## 2020-08-21 DIAGNOSIS — G5602 Carpal tunnel syndrome, left upper limb: Secondary | ICD-10-CM | POA: Diagnosis not present

## 2020-08-22 DIAGNOSIS — E042 Nontoxic multinodular goiter: Secondary | ICD-10-CM | POA: Diagnosis not present

## 2020-08-29 ENCOUNTER — Other Ambulatory Visit: Payer: Self-pay

## 2020-08-29 ENCOUNTER — Ambulatory Visit
Admission: RE | Admit: 2020-08-29 | Discharge: 2020-08-29 | Disposition: A | Discharge status: Home / Self Care | Payer: Medicare HMO | Source: Ambulatory Visit | Attending: Neurosurgery | Admitting: Neurosurgery

## 2020-08-29 DIAGNOSIS — J323 Chronic sphenoidal sinusitis: Secondary | ICD-10-CM | POA: Diagnosis not present

## 2020-08-29 DIAGNOSIS — D329 Benign neoplasm of meninges, unspecified: Secondary | ICD-10-CM

## 2020-08-29 DIAGNOSIS — J3489 Other specified disorders of nose and nasal sinuses: Secondary | ICD-10-CM | POA: Diagnosis not present

## 2020-08-29 MED ORDER — GADOBUTROL 1 MMOL/ML IV SOLN
10.0000 mL | Freq: Once | INTRAVENOUS | Status: AC | PRN
  Administered 2020-08-29: 10 mL via INTRAVENOUS

## 2020-11-22 ENCOUNTER — Other Ambulatory Visit: Payer: Self-pay

## 2020-11-22 ENCOUNTER — Emergency Department: Payer: Medicare HMO

## 2020-11-22 ENCOUNTER — Emergency Department
Admission: EM | Admit: 2020-11-22 | Discharge: 2020-11-22 | Disposition: A | Discharge status: Home / Self Care | Payer: Medicare HMO | Attending: Emergency Medicine | Admitting: Emergency Medicine

## 2020-11-22 ENCOUNTER — Encounter: Payer: Self-pay | Admitting: Emergency Medicine

## 2020-11-22 DIAGNOSIS — I2510 Atherosclerotic heart disease of native coronary artery without angina pectoris: Secondary | ICD-10-CM | POA: Diagnosis not present

## 2020-11-22 DIAGNOSIS — Z7982 Long term (current) use of aspirin: Secondary | ICD-10-CM | POA: Diagnosis not present

## 2020-11-22 DIAGNOSIS — Z7984 Long term (current) use of oral hypoglycemic drugs: Secondary | ICD-10-CM | POA: Insufficient documentation

## 2020-11-22 DIAGNOSIS — J449 Chronic obstructive pulmonary disease, unspecified: Secondary | ICD-10-CM | POA: Diagnosis not present

## 2020-11-22 DIAGNOSIS — F17210 Nicotine dependence, cigarettes, uncomplicated: Secondary | ICD-10-CM | POA: Insufficient documentation

## 2020-11-22 DIAGNOSIS — R112 Nausea with vomiting, unspecified: Secondary | ICD-10-CM | POA: Diagnosis not present

## 2020-11-22 DIAGNOSIS — H1033 Unspecified acute conjunctivitis, bilateral: Secondary | ICD-10-CM

## 2020-11-22 DIAGNOSIS — J069 Acute upper respiratory infection, unspecified: Secondary | ICD-10-CM | POA: Diagnosis not present

## 2020-11-22 DIAGNOSIS — E119 Type 2 diabetes mellitus without complications: Secondary | ICD-10-CM | POA: Diagnosis not present

## 2020-11-22 DIAGNOSIS — I119 Hypertensive heart disease without heart failure: Secondary | ICD-10-CM | POA: Diagnosis not present

## 2020-11-22 DIAGNOSIS — Z85828 Personal history of other malignant neoplasm of skin: Secondary | ICD-10-CM | POA: Insufficient documentation

## 2020-11-22 DIAGNOSIS — H5713 Ocular pain, bilateral: Secondary | ICD-10-CM | POA: Diagnosis present

## 2020-11-22 DIAGNOSIS — Z79899 Other long term (current) drug therapy: Secondary | ICD-10-CM | POA: Diagnosis not present

## 2020-11-22 DIAGNOSIS — Z20822 Contact with and (suspected) exposure to covid-19: Secondary | ICD-10-CM | POA: Diagnosis not present

## 2020-11-22 MED ORDER — IPRATROPIUM-ALBUTEROL 0.5-2.5 (3) MG/3ML IN SOLN
3.0000 mL | Freq: Once | RESPIRATORY_TRACT | Status: AC
  Administered 2020-11-22: 3 mL via RESPIRATORY_TRACT
  Filled 2020-11-22: qty 3

## 2020-11-22 MED ORDER — ERYTHROMYCIN 5 MG/GM OP OINT: 1.0000 "application " | TOPICAL_OINTMENT | Freq: Four times a day (QID) | OPHTHALMIC | 0 refills | Status: AC

## 2020-11-22 NOTE — ED Provider Notes (Signed)
[facility] Emergency Department Provider Note  ____________________________________________   Event Date/Time   First MD Initiated Contact with Patient 11/22/20 1721     (approximate)  I have reviewed the triage vital signs and the nursing notes.   HISTORY  Chief Complaint Eye Drainage and Conjunctivitis   HPI [name] is a 45 y.o. female who presents to the emergency department for evaluation of bilateral eye irritation and discharge.  Patient states that her husband became ill approximately 10 days ago with a "cold" and he "gave it to her".  She became ill with a cough, nasal congestion, pressure in her ears, followed by vomiting and diarrhea.  She was evaluated on Tuesday, tested for flu, COVID with rapid test and was negative.  She was prescribed a Z-Pak and prednisone and diagnosed with bronchitis and viral illness.  She reports no improvement in her URI symptoms, but has been symptom-free of the GI symptoms for several days.  She denies fever.  She does report shortness of breath that she reports as her stable feeling with her COPD.  She has not been taking any of her inhalers as prescribed for some time.  She reports that eye irritation began 2 days ago, starting with crusting over in the mornings.  She reports a clear discharge from them.  Denies blurred vision or pain behind the eyes.  There have not been any clear alleviating factors for her viral symptoms or eye discharge.         Past Medical History:  Diagnosis Date  . Anxiety   . Arthritis    back, knees , neck and feet  . Asthma   . Cancer ([place])    skin - basal cell on nose  . Collagen vascular disease (HCC)    rheumatoid arthritis  . COPD (chronic obstructive pulmonary disease) ([place]) 03/2017   mild  . Coronary artery disease   . Depression   . Diabetes mellitus without complication ([place])   . Endometriosis   . Fibromyalgia   . Gastric ulcer   . GERD (gastroesophageal reflux  disease)   . Headache    migraines  . Hyperlipidemia   . Hypertension   . IBS (irritable bowel syndrome)   . Lupus ([place])   . Myocardial infarction ([place]) 08/2009  . Pneumonia   . Sleep apnea    uses cpap  . Tremors of nervous system    essential tremor is hands    Patient Active Problem List   Diagnosis Date Noted  . Postoperative state 08/15/2017  . BMI 50.0-59.9, adult ([place]) 06/27/2017  . Lupus ([place]) 06/16/2017  . COPD, mild ([place]) 05/25/2017  . Fibromyalgia 05/25/1997    Past Surgical History:  Procedure Laterality Date  . ABDOMINAL HYSTERECTOMY    . CARDIAC CATHETERIZATION  2010   stent x 1; 12/31/13 LHC ([facility] in [place]): 30-40% dLAD, mid LAD stent patent, 30% mLCX, 40% mRCA. Medical therapy.  . CARPAL TUNNEL RELEASE Left 06/11/2020   Procedure: CARPAL TUNNEL RELEASE;  Surgeon: Iran Planas, MD;  Location: [place];  Service: Orthopedics;  Laterality: Left;  with local anesthesia  . CESAREAN SECTION  2006  . CHOLECYSTECTOMY  2008  . COLONOSCOPY WITH PROPOFOL N/A 01/18/2018   Procedure: COLONOSCOPY WITH PROPOFOL;  Surgeon: Toledo, Benay Pike, MD;  Location: [facility];  Service: Gastroenterology;  Laterality: N/A;  . DIAGNOSTIC LAPAROSCOPY  04/2000  . DILATION AND CURETTAGE OF UTERUS  04/1997 missed ab  . LAPAROSCOPIC OVARIAN CYSTECTOMY  Right 2013  . MULTIPLE TOOTH EXTRACTIONS  1998   4 wisdom teeth  . UTERINE STENT PLACEMENT Bilateral 08/15/2017   Procedure: UTERINE STENT PLACEMENT;  Surgeon: Schermerhorn, Gwen Her, MD;  Location: [facility] ORS;  Service: Gynecology;  Laterality: Bilateral;    Prior to Admission medications   Medication Sig Start Date End Date Taking? Authorizing Provider  erythromycin ophthalmic ointment Place 1 application into both eyes 4 (four) times daily for 7 days. 11/22/20 11/29/20 Yes Marlana Salvage, PA  acetaminophen (TYLENOL) 500 MG tablet Take 500 mg by mouth every 6 (six) hours as needed for mild pain or moderate pain.      [provider]  albuterol (PROVENTIL HFA;VENTOLIN HFA) 108 (90 Base) MCG/ACT inhaler Inhale 2 puffs into the lungs every 6 (six) hours as needed for wheezing or shortness of breath.    [provider]  amitriptyline (ELAVIL) 10 MG tablet Take 10 mg by mouth at bedtime. 05/01/20   [provider]  aspirin EC 81 MG tablet Take 81 mg by mouth daily.    [provider]  atorvastatin (LIPITOR) 80 MG tablet Take 80 mg by mouth at bedtime.    [provider]  azelastine (ASTELIN) 0.1 % nasal spray Place 2 sprays into the nose daily as needed for rhinitis or allergies.  04/19/18   [provider]  cholecalciferol (VITAMIN D) 25 MCG (1000 UNIT) tablet Take 1,000 Units by mouth daily.     [provider]  COMBIVENT RESPIMAT 20-100 MCG/ACT AERS respimat Inhale 1 puff into the lungs daily as needed for wheezing or shortness of breath.  03/04/20   [provider]  cyanocobalamin 1000 MCG tablet Take 1,000 mcg by mouth daily.    [provider]  diphenhydrAMINE (BENADRYL) 25 MG tablet Take 25 mg by mouth every 6 (six) hours as needed for allergies.     [provider]  DULoxetine (CYMBALTA) 60 MG capsule Take 60 mg by mouth 2 (two) times daily. 07/13/17   [provider]  EMGALITY 120 MG/ML SOAJ Inject 120 mg as directed every 30 (thirty) days. 05/30/20   [provider]  ENBREL SURECLICK 50 MG/ML injection Inject 50 mg into the skin once a week. Tuesday 06/02/20   [provider]  FARXIGA 10 MG TABS tablet Take 10 mg by mouth every morning. 05/30/20   [provider]  folic acid (FOLVITE) 1 MG tablet Take 1 mg by mouth daily.  09/20/18   [provider]  hydroxychloroquine (PLAQUENIL) 200 MG tablet Take 200 mg by mouth 2 (two) times daily. 07/13/17   [provider]  ibuprofen (ADVIL,MOTRIN) 800 MG tablet Take 1 tablet (800 mg total) by mouth 3 (three) times daily as needed for  moderate pain. Patient taking differently: Take 800 mg by mouth 3 (three) times daily as needed (inflammation).  08/18/17   Schermerhorn, Gwen Her, MD  ipratropium-albuterol (DUONEB) 0.5-2.5 (3) MG/3ML SOLN Inhale 2.5 mLs into the lungs every 4 (four) hours as needed. 01/18/19   [provider]  levocetirizine (XYZAL) 5 MG tablet Take 5 mg by mouth at bedtime.     [provider]  metFORMIN (GLUCOPHAGE-XR) 500 MG 24 hr tablet Take 1,000 mg by mouth daily with supper. 11/29/18 11/29/19  [provider]  Methotrexate Sodium (METHOTREXATE, PF,) 200 MG/8ML injection Inject 20 mg into the muscle once a week. Friday or Saturday    [provider]  metoprolol tartrate (LOPRESSOR) 50 MG tablet Take 50 mg  by mouth 2 (two) times daily.    [provider]  montelukast (SINGULAIR) 10 MG tablet Take 10 mg by mouth at bedtime.    [provider]  Multiple Vitamins-Minerals (MULTIVITAMIN WITH MINERALS) tablet Take 1 tablet by mouth daily.    [provider]  NICOTROL 10 MG inhaler Inhale 10 mg into the lungs daily. 05/20/20   [provider]  NURTEC 75 MG TBDP Take 1 tablet by mouth daily as needed for migraine. 05/30/20   [provider]  nystatin (MYCOSTATIN/NYSTOP) powder Apply 1 application topically daily as needed for rash. 02/08/20   [provider]  oxyCODONE-acetaminophen (PERCOCET) 10-325 MG tablet Take 1 tablet by mouth every 4 (four) hours as needed for pain.    [provider]  pantoprazole (PROTONIX) 40 MG tablet Take 40 mg by mouth daily.    [provider]  promethazine (PHENERGAN) 25 MG tablet Take 25 mg by mouth daily as needed for nausea. 05/09/20   [provider]  rizatriptan (MAXALT) 10 MG tablet Take 10 mg by mouth as needed for migraine.     [provider]  traZODone (DESYREL) 100 MG tablet Take 100 mg by mouth at bedtime.    [provider]     Allergies Glimepiride, Avelox [moxifloxacin hcl in nacl], Levaquin [levofloxacin in d5w], Penicillins, Sulfa antibiotics, Gabapentin, Macrobid [nitrofurantoin], and Tetracyclines & related  Family History  Problem Relation Age of Onset  . Breast cancer Cousin        2nd paternal side    Social History Social History   Tobacco Use  . Smoking status: Current Every Day Smoker    Packs/day: 1.00    Types: Cigarettes  . Smokeless tobacco: Never Used  Vaping Use  . Vaping Use: Never used  Substance Use Topics  . Alcohol use: No  . Drug use: No    Review of Systems Constitutional: No fever/chills Eyes: + Eye irritation, no visual changes. ENT: + sore throat. Cardiovascular: Denies chest pain. Respiratory: + shortness of breath. Gastrointestinal: No abdominal pain.  + nausea, + vomiting.  No diarrhea.  No constipation. Genitourinary: Negative for dysuria. Musculoskeletal: Negative for back pain. Skin: Negative for rash. Neurological: Negative for headaches, focal weakness or numbness.   ____________________________________________   PHYSICAL EXAM:  VITAL SIGNS: ED Triage Vitals  Enc Vitals Group     BP 11/22/20 1727 (!) 184/109     Pulse Rate 11/22/20 1727 98     Resp 11/22/20 1727 19     Temp 11/22/20 1727 98.8 F (37.1 C)     Temp Source 11/22/20 1727 Oral     SpO2 11/22/20 1727 95 %     Weight --      Height --      Head Circumference --      Peak Flow --      Pain Score 11/22/20 1709 9     Pain Loc --      Pain Edu? --      Excl. in [place]? --    Constitutional: Alert and oriented. Well appearing and in no acute distress. Eyes: Conjunctivae are injected bilaterally, no drainage appreciated.Marland Kitchen PERRL. EOMI. Head: Atraumatic. Nose: No congestion/rhinnorhea. Ears: The bilateral TMs are visualized, pearly gray with no erythema or bulging. Mouth/Throat: Mucous membranes are moist.  Oropharynx erythematous without any tonsillar enlargement or exudate. Neck: No  stridor.   Lymphatic: No cervical lymphadenopathy Cardiovascular: Normal rate, regular rhythm. Grossly normal heart sounds.  Good peripheral circulation.  Respiratory: Normal respiratory effort.  No retractions. Lungs with decreased air movement, worse in the bases.  There is forced expiratory wheeze. Gastrointestinal: Soft and nontender. No distention. No abdominal bruits. No CVA tenderness. Musculoskeletal: No lower extremity tenderness nor edema.  No joint effusions. Neurologic:  Normal speech and language. No gross focal neurologic deficits are appreciated. No gait instability. Skin:  Skin is warm, dry and intact. No rash noted. Psychiatric: Mood and affect are normal. Speech and behavior are normal.  ____________________________________________   LABS (all labs ordered are listed, but only abnormal results are displayed)  Labs Reviewed  SARS CORONAVIRUS 2 (TAT 6-24 HRS)   ____________________________________________  RADIOLOGY Lenoria Farrier, personally viewed and evaluated these images (plain radiographs) as part of my medical decision making, as well as reviewing the written report by the radiologist.  ED provider interpretation: No acute pneumonia identified  Official radiology report(s): DG Chest 2 View  Result Date: 11/22/2020 CLINICAL DATA:  Cough. EXAM: CHEST - 2 VIEW COMPARISON:  None. FINDINGS: The heart size and mediastinal contours are within normal limits. Both lungs are clear. Radiopaque surgical clips are seen within the right upper quadrant. The visualized skeletal structures are unremarkable. IMPRESSION: No active cardiopulmonary disease. Electronically Signed   By: Virgina Norfolk M.D.   On: 11/22/2020 18:15    ____________________________________________   INITIAL IMPRESSION / ASSESSMENT AND PLAN / ED COURSE  As part of my medical decision making, I reviewed the following data within the [facility] notes reviewed and  incorporated, Labs reviewed, Radiograph reviewed and Notes from prior ED visits        Patient is a 45 year old female with past medical history significant for COPD who presents to the emergency department for evaluation of bilateral eye irritation and drainage for the last 2 days in the setting of viral URI and GI symptoms over the last week.  She was seen by a provider, was negative with a rapid test for Covid and treated with a Z-Pak and prednisone given her COPD status.  See HPI for further details.  In triage, the patient is afebrile, does have quite an elevated blood pressure at 184/109 and is satting at 95%.  She does report taking her blood pressure medications today as prescribed.  On physical exam, the patient does have injected conjunctive a bilaterally, no obvious drainage at this time.  She does have an erythematous throat without any enlargement of her tonsils or exudate.  She has tight air movement bilaterally, worse in the bases and she does have a wheeze that is audible with forced expiration.  Given that she has not been taking any of her COPD medications as prescribed, will initiate treatment for her cough and shortness of breath with a DuoNeb as well as obtain a chest x-ray to rule out acute pneumonia.  We will also obtain a Covid test, but will be a send out and will not be available for results prior to her discharge.  Subsequent to first DuoNeb, repeat auscultation reveals wheezing throughout, improved air movement and the patient reports improvement in some of her symptoms.  Given some resolution but not full, will repeat DuoNeb.  In the interim, patient was also ambulated and SPO2 did not drop below 94%.  Blood pressure is also improving.  In treatment for the patient's eyes, feel that this is likely viral related to her other viral illness, however will cover for possible bacterial conjunctivitis with erythromycin ointment.  Also encouraged the patient  to take her COPD medications as  prescribed in order to improve her cough related to her viral illness.  Patient states that she also has a visit scheduled with her pulmonologist on Monday, and already has close follow-up.  At this time, given that she does not have a fever and chest x-ray is clear, do not see any clear indication at this time for further systemic antibiotics.  Return precautions were discussed at length with the patient, and she will return for any acute worsening.  In the interim, patient is stable this time for outpatient follow-up.      ____________________________________________   FINAL CLINICAL IMPRESSION(S) / ED DIAGNOSES  Final diagnoses:  Acute conjunctivitis of both eyes, unspecified acute conjunctivitis type  Viral URI with cough  Chronic obstructive pulmonary disease, unspecified COPD type [facility])     ED Discharge Orders         Ordered    erythromycin ophthalmic ointment  4 times daily        11/22/20 2021          *Please note:  Bettyjo Lundblad was evaluated in Emergency Department on 11/22/2020 for the symptoms described in the history of present illness. She was evaluated in the context of the global COVID-19 pandemic, which necessitated consideration that the patient might be at risk for infection with the SARS-CoV-2 virus that causes COVID-19. Institutional protocols and algorithms that pertain to the evaluation of patients at risk for COVID-19 are in a state of rapid change based on information released by regulatory bodies including the CDC and federal and state organizations. These policies and algorithms were followed during the patient's care in the ED.  Some ED evaluations and interventions may be delayed as a result of limited staffing during and the pandemic.*   Note:  This document was prepared using Dragon voice recognition software and may include unintentional dictation errors.   Marlana Salvage, PA 11/22/20 2052    Duffy Bruce, MD 11/24/20 [phone]

## 2020-11-22 NOTE — ED Notes (Signed)
Pt reports takes BP meds regularly. States took them today. Pt reports sometimes has issues with her sinuses; reports recently had "cold, stomach virus, and bronchitis". Reports smokes daily. Pt's eyes blurry, itchy, painful. Eyes currently both pink and irritated-looking. No major swelling noted. White/yellow drainage per pt. No drainage noted currently.

## 2020-11-22 NOTE — ED Triage Notes (Signed)
Pt reports over the last few days she has woken up with her eyes sealed shut, draining and burning

## 2020-11-22 NOTE — ED Notes (Signed)
Pt maintained ambulatory sat of 94% while walking. States that SOB comes from pain in her knees when she's walking.

## 2020-11-22 NOTE — Discharge Instructions (Addendum)
Take COPD meds as prescribed. Use eye antibiotics as prescribed. Follow up with primary care or pulmonology this week as previously scheduled. Return to ER for any worsening.

## 2020-11-23 LAB — SARS CORONAVIRUS 2 (TAT 6-24 HRS): SARS Coronavirus 2: NEGATIVE

## 2021-07-31 ENCOUNTER — Other Ambulatory Visit: Payer: Self-pay | Admitting: Pain Medicine

## 2021-07-31 DIAGNOSIS — M79606 Pain in leg, unspecified: Secondary | ICD-10-CM

## 2021-07-31 DIAGNOSIS — M545 Low back pain, unspecified: Secondary | ICD-10-CM

## 2021-08-14 ENCOUNTER — Other Ambulatory Visit: Payer: Self-pay | Admitting: Neurosurgery

## 2021-08-14 DIAGNOSIS — D329 Benign neoplasm of meninges, unspecified: Secondary | ICD-10-CM

## 2021-08-19 ENCOUNTER — Ambulatory Visit
Admission: RE | Admit: 2021-08-19 | Discharge: 2021-08-19 | Disposition: A | Discharge status: Home / Self Care | Payer: Medicare HMO | Source: Ambulatory Visit | Attending: Pain Medicine | Admitting: Pain Medicine

## 2021-08-19 ENCOUNTER — Other Ambulatory Visit: Payer: Self-pay

## 2021-08-19 DIAGNOSIS — M79606 Pain in leg, unspecified: Secondary | ICD-10-CM

## 2021-08-19 DIAGNOSIS — M545 Low back pain, unspecified: Secondary | ICD-10-CM

## 2021-08-19 DIAGNOSIS — G8929 Other chronic pain: Secondary | ICD-10-CM

## 2021-08-25 ENCOUNTER — Other Ambulatory Visit: Payer: Medicare HMO

## 2021-09-07 ENCOUNTER — Emergency Department: Payer: Medicare HMO

## 2021-09-07 ENCOUNTER — Emergency Department
Admission: EM | Admit: 2021-09-07 | Discharge: 2021-09-07 | Disposition: A | Discharge status: Home / Self Care | Payer: Medicare HMO | Attending: Emergency Medicine | Admitting: Emergency Medicine

## 2021-09-07 ENCOUNTER — Other Ambulatory Visit: Payer: Self-pay

## 2021-09-07 ENCOUNTER — Encounter: Payer: Self-pay | Admitting: Emergency Medicine

## 2021-09-07 DIAGNOSIS — E119 Type 2 diabetes mellitus without complications: Secondary | ICD-10-CM | POA: Insufficient documentation

## 2021-09-07 DIAGNOSIS — Z79899 Other long term (current) drug therapy: Secondary | ICD-10-CM | POA: Insufficient documentation

## 2021-09-07 DIAGNOSIS — L03119 Cellulitis of unspecified part of limb: Secondary | ICD-10-CM

## 2021-09-07 DIAGNOSIS — Z7951 Long term (current) use of inhaled steroids: Secondary | ICD-10-CM | POA: Diagnosis not present

## 2021-09-07 DIAGNOSIS — W228XXA Striking against or struck by other objects, initial encounter: Secondary | ICD-10-CM | POA: Diagnosis not present

## 2021-09-07 DIAGNOSIS — J449 Chronic obstructive pulmonary disease, unspecified: Secondary | ICD-10-CM | POA: Diagnosis not present

## 2021-09-07 DIAGNOSIS — Z7984 Long term (current) use of oral hypoglycemic drugs: Secondary | ICD-10-CM | POA: Insufficient documentation

## 2021-09-07 DIAGNOSIS — I2510 Atherosclerotic heart disease of native coronary artery without angina pectoris: Secondary | ICD-10-CM | POA: Insufficient documentation

## 2021-09-07 DIAGNOSIS — S61411A Laceration without foreign body of right hand, initial encounter: Secondary | ICD-10-CM | POA: Diagnosis not present

## 2021-09-07 DIAGNOSIS — M79641 Pain in right hand: Secondary | ICD-10-CM

## 2021-09-07 DIAGNOSIS — S6991XA Unspecified injury of right wrist, hand and finger(s), initial encounter: Secondary | ICD-10-CM | POA: Diagnosis present

## 2021-09-07 DIAGNOSIS — L03113 Cellulitis of right upper limb: Secondary | ICD-10-CM | POA: Insufficient documentation

## 2021-09-07 DIAGNOSIS — F17210 Nicotine dependence, cigarettes, uncomplicated: Secondary | ICD-10-CM | POA: Insufficient documentation

## 2021-09-07 DIAGNOSIS — Z85828 Personal history of other malignant neoplasm of skin: Secondary | ICD-10-CM | POA: Insufficient documentation

## 2021-09-07 DIAGNOSIS — Z7982 Long term (current) use of aspirin: Secondary | ICD-10-CM | POA: Insufficient documentation

## 2021-09-07 DIAGNOSIS — Z23 Encounter for immunization: Secondary | ICD-10-CM | POA: Diagnosis not present

## 2021-09-07 DIAGNOSIS — J45909 Unspecified asthma, uncomplicated: Secondary | ICD-10-CM | POA: Diagnosis not present

## 2021-09-07 DIAGNOSIS — I10 Essential (primary) hypertension: Secondary | ICD-10-CM | POA: Diagnosis not present

## 2021-09-07 MED ORDER — CEPHALEXIN 500 MG PO CAPS: 500.0000 mg | ORAL_CAPSULE | Freq: Four times a day (QID) | ORAL | 0 refills | Status: AC

## 2021-09-07 MED ORDER — TETANUS-DIPHTHERIA TOXOIDS TD 5-2 LFU IM INJ
0.5000 mL | INJECTION | Freq: Once | INTRAMUSCULAR | Status: AC
  Administered 2021-09-07: 23:00:00 0.5 mL via INTRAMUSCULAR
  Filled 2021-09-07: qty 0.5

## 2021-09-07 NOTE — ED Triage Notes (Signed)
Patient ambulatory to triage with steady gait, without difficulty or distress noted; pt reports on Saturday she hit her rt hand on molding; c/o persistent pain

## 2021-09-07 NOTE — ED Provider Notes (Signed)
[facility] Emergency Department Provider Note    ____________________________________________   Event Date/Time   First MD Initiated Contact with Patient 09/07/21 2211     (approximate)  I have reviewed the triage vital signs and the nursing notes.   HISTORY  Chief Complaint Hand Injury   HPI [name] is a 45 y.o. female, history of asthma, diabetes, CAD, and hypertension, presents emergency department for evaluation of right hand injury.  Patient states that she hit her right hand on molding approximately 2 days prior, causing bruising and multiple cuts/abrasions on the dorsal aspect of her hand.  She says they have been bleeding intermittently since, but has now stopped.  She is currently complaining of significant pain in her hand believes that she may have broke something.  Denies head injury, LOC, forearm pain, elbow pain, shoulder pain, fever/chills, chest pain, or shortness of breath.   History limited by: No limitations.  Past Medical History:  Diagnosis Date   Anxiety    Arthritis    back, knees , neck and feet   Asthma    Cancer ([place])    skin - basal cell on nose   Collagen vascular disease (HCC)    rheumatoid arthritis   COPD (chronic obstructive pulmonary disease) ([place]) 03/2017   mild   Coronary artery disease    Depression    Diabetes mellitus without complication (HCC)    Endometriosis    Fibromyalgia    Gastric ulcer    GERD (gastroesophageal reflux disease)    Headache    migraines   Hyperlipidemia    Hypertension    IBS (irritable bowel syndrome)    Lupus ([place])    Myocardial infarction ([place]) 08/2009   Pneumonia    Sleep apnea    uses cpap   Tremors of nervous system    essential tremor is hands    Patient Active Problem List   Diagnosis Date Noted   Postoperative state 08/15/2017   BMI 50.0-59.9, adult ([place]) 06/27/2017   Lupus ([place]) 06/16/2017   COPD, mild ([place]) 05/25/2017   Fibromyalgia 05/25/1997     Past Surgical History:  Procedure Laterality Date   ABDOMINAL HYSTERECTOMY     CARDIAC CATHETERIZATION  2010   stent x 1; 12/31/13 LHC ([facility] in [place]): 30-40% dLAD, mid LAD stent patent, 30% mLCX, 40% mRCA. Medical therapy.   CARPAL TUNNEL RELEASE Left 06/11/2020   Procedure: CARPAL TUNNEL RELEASE;  Surgeon: Iran Planas, MD;  Location: [facility];  Service: Orthopedics;  Laterality: Left;  with local anesthesia   CESAREAN SECTION  2006   CHOLECYSTECTOMY  2008   COLONOSCOPY WITH PROPOFOL N/A 01/18/2018   Procedure: COLONOSCOPY WITH PROPOFOL;  Surgeon: Toledo, Benay Pike, MD;  Location: [facility];  Service: Gastroenterology;  Laterality: N/A;   DIAGNOSTIC LAPAROSCOPY  04/2000   DILATION AND CURETTAGE OF UTERUS  04/1997 missed ab   LAPAROSCOPIC OVARIAN CYSTECTOMY Right 2013   MULTIPLE TOOTH EXTRACTIONS  1998   4 wisdom teeth   UTERINE STENT PLACEMENT Bilateral 08/15/2017   Procedure: UTERINE STENT PLACEMENT;  Surgeon: Boykin Nearing, MD;  Location: [facility] ORS;  Service: Gynecology;  Laterality: Bilateral;    Prior to Admission medications   Medication Sig Start Date End Date Taking? Authorizing Provider  cephALEXin (KEFLEX) 500 MG capsule Take 1 capsule (500 mg total) by mouth 4 (four) times daily for 10 days. 09/07/21 09/17/21 Yes Teodoro Spray, PA  acetaminophen (TYLENOL) 500 MG tablet Take 500 mg by  mouth every 6 (six) hours as needed for mild pain or moderate pain.     [provider]  albuterol (PROVENTIL HFA;VENTOLIN HFA) 108 (90 Base) MCG/ACT inhaler Inhale 2 puffs into the lungs every 6 (six) hours as needed for wheezing or shortness of breath.    [provider]  amitriptyline (ELAVIL) 10 MG tablet Take 10 mg by mouth at bedtime. 05/01/20   [provider]  aspirin EC 81 MG tablet Take 81 mg by mouth daily.    [provider]  atorvastatin (LIPITOR) 80 MG tablet Take 80 mg by mouth at bedtime.    [provider]   azelastine (ASTELIN) 0.1 % nasal spray Place 2 sprays into the nose daily as needed for rhinitis or allergies.  04/19/18   [provider]  cholecalciferol (VITAMIN D) 25 MCG (1000 UNIT) tablet Take 1,000 Units by mouth daily.     [provider]  COMBIVENT RESPIMAT 20-100 MCG/ACT AERS respimat Inhale 1 puff into the lungs daily as needed for wheezing or shortness of breath.  03/04/20   [provider]  cyanocobalamin 1000 MCG tablet Take 1,000 mcg by mouth daily.    [provider]  diphenhydrAMINE (BENADRYL) 25 MG tablet Take 25 mg by mouth every 6 (six) hours as needed for allergies.     [provider]  DULoxetine (CYMBALTA) 60 MG capsule Take 60 mg by mouth 2 (two) times daily. 07/13/17   [provider]  EMGALITY 120 MG/ML SOAJ Inject 120 mg as directed every 30 (thirty) days. 05/30/20   [provider]  ENBREL SURECLICK 50 MG/ML injection Inject 50 mg into the skin once a week. Tuesday 06/02/20   [provider]  FARXIGA 10 MG TABS tablet Take 10 mg by mouth every morning. 05/30/20   [provider]  folic acid (FOLVITE) 1 MG tablet Take 1 mg by mouth daily.  09/20/18   [provider]  hydroxychloroquine (PLAQUENIL) 200 MG tablet Take 200 mg by mouth 2 (two) times daily. 07/13/17   [provider]  ibuprofen (ADVIL,MOTRIN) 800 MG tablet Take 1 tablet (800 mg total) by mouth 3 (three) times daily as needed for moderate pain. Patient taking differently: Take 800 mg by mouth 3 (three) times daily as needed (inflammation).  08/18/17   Schermerhorn, Gwen Her, MD  ipratropium-albuterol (DUONEB) 0.5-2.5 (3) MG/3ML SOLN Inhale 2.5 mLs into the lungs every 4 (four) hours as needed. 01/18/19   [provider]  levocetirizine (XYZAL) 5 MG tablet Take 5 mg by mouth at bedtime.     [provider]  metFORMIN (GLUCOPHAGE-XR) 500 MG 24 hr tablet Take 1,000 mg by mouth daily with supper. 11/29/18 11/29/19   [provider]  Methotrexate Sodium (METHOTREXATE, PF,) 200 MG/8ML injection Inject 20 mg into the muscle once a week. Friday or Saturday    [provider]  metoprolol tartrate (LOPRESSOR) 50 MG tablet Take 50 mg by mouth 2 (two) times daily.    [provider]  montelukast (SINGULAIR) 10 MG tablet Take 10 mg by mouth at bedtime.    [provider]  Multiple Vitamins-Minerals (MULTIVITAMIN WITH MINERALS) tablet Take 1 tablet by mouth daily.    [provider]  NICOTROL 10 MG inhaler Inhale 10 mg into the lungs daily. 05/20/20   [provider]  NURTEC 75 MG TBDP Take 1 tablet by mouth daily as needed for migraine. 05/30/20   [provider]  nystatin (MYCOSTATIN/NYSTOP) powder Apply 1  application topically daily as needed for rash. 02/08/20   [provider]  oxyCODONE-acetaminophen (PERCOCET) 10-325 MG tablet Take 1 tablet by mouth every 4 (four) hours as needed for pain.    [provider]  pantoprazole (PROTONIX) 40 MG tablet Take 40 mg by mouth daily.    [provider]  promethazine (PHENERGAN) 25 MG tablet Take 25 mg by mouth daily as needed for nausea. 05/09/20   [provider]  rizatriptan (MAXALT) 10 MG tablet Take 10 mg by mouth as needed for migraine.     [provider]  traZODone (DESYREL) 100 MG tablet Take 100 mg by mouth at bedtime.    [provider]    Allergies Glimepiride, Avelox [moxifloxacin hcl in nacl], Levaquin [levofloxacin in d5w], Penicillins, Sulfa antibiotics, Gabapentin, Macrobid [nitrofurantoin], and Tetracyclines & related  Family History  Problem Relation Age of Onset   Breast cancer Cousin        2nd paternal side    Social History Social History   Tobacco Use   Smoking status: Every Day    Packs/day: 1.00    Types: Cigarettes   Smokeless tobacco: Never  Vaping Use   Vaping Use: Never used  Substance Use Topics   Alcohol use: No    Drug use: No    Review of Systems Review of Systems  Constitutional:  Negative for chills and fever.  HENT:  Negative for ear pain and sore throat.   Eyes:  Negative for blurred vision.  Respiratory:  Negative for sputum production and shortness of breath.   Cardiovascular:  Negative for chest pain and leg swelling.  Gastrointestinal:  Negative for abdominal pain and vomiting.  Genitourinary:  Negative for dysuria, flank pain and hematuria.  Musculoskeletal:  Negative for myalgias.       Positive for right hand pain.  Skin:  Negative for rash.  Neurological:  Negative for headaches.    10-point ROS otherwise negative. ____________________________________________   PHYSICAL EXAM:  VITAL SIGNS: ED Triage Vitals  Enc Vitals Group     BP 09/07/21 2058 (!) 138/94     Pulse Rate 09/07/21 2058 90     Resp 09/07/21 2058 20     Temp 09/07/21 2058 97.9 F (36.6 C)     Temp Source 09/07/21 2058 Oral     SpO2 09/07/21 2058 96 %     Weight 09/07/21 2051 300 lb (136.1 kg)     Height 09/07/21 2051 5\' 4"  (1.626 m)     Head Circumference --      Peak Flow --      Pain Score 09/07/21 2051 8     Pain Loc --      Pain Edu? --      Excl. in [place]? --     Physical Exam Vitals and nursing note reviewed.  Constitutional:      General: She is not in acute distress.    Appearance: Normal appearance. She is not ill-appearing.  HENT:     Head: Normocephalic and atraumatic.     Right Ear: External ear normal.     Left Ear: External ear normal.     Nose: Nose normal.     Mouth/Throat:     Mouth: Mucous membranes are moist.     Pharynx: Oropharynx is clear.  Eyes:     Conjunctiva/sclera: Conjunctivae normal.     Pupils: Pupils are equal, round, and reactive to light.  Cardiovascular:     Rate and Rhythm: Normal  rate and regular rhythm.  Pulmonary:     Effort: Pulmonary effort is normal. No respiratory distress.     Breath sounds: Normal breath sounds. No wheezing, rhonchi or rales.   Abdominal:     General: Abdomen is flat. There is no distension.     Palpations: Abdomen is soft.  Musculoskeletal:        General: Normal range of motion.     Cervical back: Normal range of motion and neck supple.     Comments: Right hand is diffusely swollen and warm.  Mild erythema extending to the wrist.  Multiple contusions and healed abrasions/lacerations across the dorsum of the hand.  Range of motion intact.  Pulses and sensation intact.  No active bleeding or discharge.  Skin:    General: Skin is warm and dry.  Neurological:     General: No focal deficit present.     Mental Status: She is alert. Mental status is at baseline.  Psychiatric:        Mood and Affect: Mood normal.        Behavior: Behavior normal.        Thought Content: Thought content normal.        Judgment: Judgment normal.     ____________________________________________    LABS  (all labs ordered are listed, but only abnormal results are displayed)  Labs Reviewed - No data to display   ____________________________________________   EKG Not applicable.   ____________________________________________    RADIOLOGY I personally viewed and evaluated these images as part of my medical decision making, as well as reviewing the written report by the radiologist.  ED Provider Interpretation: I agree with the interpretation of the radiologist.  No acute osseous abnormality.  DG Hand Complete Right  Result Date: 09/07/2021 CLINICAL DATA:  Pain and bruising after hitting hand on molding EXAM: RIGHT HAND - COMPLETE 3+ VIEW COMPARISON:  None. FINDINGS: There is no evidence of fracture or dislocation. There is no evidence of significant arthropathy or other focal bone abnormality. Soft tissues are unremarkable. IMPRESSION: No acute osseous abnormality. Electronically Signed   By: Dahlia Bailiff M.D.   On: 09/07/2021 21:10    ____________________________________________   PROCEDURES  Procedures    Medications  tetanus & diphtheria toxoids (adult) (TENIVAC) injection 0.5 mL (0.5 mLs Intramuscular Given 09/07/21 2247)    Critical Care performed: No  ____________________________________________   INITIAL IMPRESSION / ASSESSMENT AND PLAN / ED COURSE  Pertinent labs & imaging results that were available during my care of the patient were reviewed by me and considered in my medical decision making (see chart for details).       [name] is a 45 y.o. female, history of asthma, diabetes, CAD, and hypertension, presents emergency department for evaluation of right hand injury.  Patient states that she hit her right hand on molding approximately 2 days prior, causing bruising and multiple cuts/abrasions on the dorsal aspect of her hand.  She says they have been bleeding intermittently since, but has now stopped.  She is currently complaining of significant pain in her hand believes that she may have broke something.  Denies head injury, LOC, forearm pain, elbow pain, shoulder pain, fever/chills, chest pain, or shortness of breath.  Patient appears well.  Right hand is diffusely swollen and warm.  Mild erythema extending to the wrist.  Multiple contusions and healed abrasions/lacerations across the dorsum of the hand.  Range of motion intact.  Pulses and sensation intact.  No active bleeding or  discharge.  Physical exam is otherwise unremarkable.  Patient was initially provided a tetanus shot.  X-ray is negative for acute osseous abnormality.  Given of significant swelling, erythema, and warmth, I suspect that the patient may have a cellulitis of her right hand.  We will discharge this patient with a prescription for cephalexin.  Advised patient to take the full course of antibiotics and return to the emergency department at any time if she begins to experience new or worsening symptoms.  Advised her to follow-up with her primary care provider or orthopedist if she continues to experience  pain in her hand despite antibiotic treatment.        ____________________________________________   FINAL CLINICAL IMPRESSION(S) / ED DIAGNOSES  Final diagnoses:  Right hand pain  Cellulitis of hand     NEW MEDICATIONS STARTED DURING THIS VISIT:  ED Discharge Orders          Ordered    cephALEXin (KEFLEX) 500 MG capsule  4 times daily        09/07/21 2316             Note:  This document was prepared using Dragon voice recognition software and may include unintentional dictation errors.    Teodoro Spray, [place] 09/08/21 1027    Harvest Dark, MD 09/11/21 [phone]

## 2021-09-07 NOTE — Discharge Instructions (Addendum)
-  Follow up with your established orthopedic, as discussed, if symptoms worsen.  -Take the full course of your antibiotics.

## 2021-09-10 ENCOUNTER — Ambulatory Visit
Admission: RE | Admit: 2021-09-10 | Discharge: 2021-09-10 | Disposition: A | Discharge status: Home / Self Care | Payer: Medicare HMO | Source: Ambulatory Visit | Attending: Neurosurgery | Admitting: Neurosurgery

## 2021-09-10 DIAGNOSIS — D329 Benign neoplasm of meninges, unspecified: Secondary | ICD-10-CM | POA: Diagnosis not present

## 2021-09-10 MED ORDER — GADOBUTROL 1 MMOL/ML IV SOLN
10.0000 mL | Freq: Once | INTRAVENOUS | Status: AC | PRN
  Administered 2021-09-10: 11:00:00 10 mL via INTRAVENOUS

## 2021-10-03 ENCOUNTER — Emergency Department
Admission: EM | Admit: 2021-10-03 | Discharge: 2021-10-03 | Disposition: A | Discharge status: Home / Self Care | Payer: Medicare Other | Attending: Emergency Medicine | Admitting: Emergency Medicine

## 2021-10-03 ENCOUNTER — Emergency Department: Payer: Medicare Other

## 2021-10-03 ENCOUNTER — Other Ambulatory Visit: Payer: Self-pay

## 2021-10-03 DIAGNOSIS — R109 Unspecified abdominal pain: Secondary | ICD-10-CM | POA: Insufficient documentation

## 2021-10-03 DIAGNOSIS — M5441 Lumbago with sciatica, right side: Secondary | ICD-10-CM | POA: Insufficient documentation

## 2021-10-03 DIAGNOSIS — M549 Dorsalgia, unspecified: Secondary | ICD-10-CM | POA: Diagnosis present

## 2021-10-03 LAB — URINALYSIS, COMPLETE (UACMP) WITH MICROSCOPIC
Bilirubin Urine: NEGATIVE
Glucose, UA: >=500 mg/dL — AB
Ketones, ur: NEGATIVE mg/dL
Leukocytes,Ua: NEGATIVE
Nitrite: NEGATIVE
Protein, ur: NEGATIVE mg/dL
Specific Gravity, Urine: 1.027 (ref 1.005–1.030)
pH: 5 (ref 5.0–8.0)

## 2021-10-03 MED ORDER — METHOCARBAMOL 500 MG PO TABS
500.0000 mg | ORAL_TABLET | Freq: Three times a day (TID) | ORAL | 0 refills | Status: AC | PRN
Start: 2021-10-03 — End: 2021-10-08

## 2021-10-03 MED ORDER — ONDANSETRON 4 MG PO TBDP
4.0000 mg | ORAL_TABLET | Freq: Once | ORAL | Status: DC
  Filled 2021-10-03: qty 1

## 2021-10-03 MED ORDER — METHYLPREDNISOLONE SODIUM SUCC 125 MG IJ SOLR
125.0000 mg | Freq: Once | INTRAMUSCULAR | Status: AC
  Administered 2021-10-03: 125 mg via INTRAMUSCULAR
  Filled 2021-10-03: qty 2

## 2021-10-03 MED ORDER — KETOROLAC TROMETHAMINE 30 MG/ML IJ SOLN
30.0000 mg | Freq: Once | INTRAMUSCULAR | Status: AC
  Administered 2021-10-03: 30 mg via INTRAMUSCULAR
  Filled 2021-10-03: qty 1

## 2021-10-03 MED ORDER — PREDNISONE 10 MG (21) PO TBPK: ORAL_TABLET | ORAL | 0 refills | Status: DC

## 2021-10-03 MED ORDER — OXYCODONE-ACETAMINOPHEN 5-325 MG PO TABS
1.0000 | ORAL_TABLET | Freq: Once | ORAL | Status: DC
  Filled 2021-10-03: qty 1

## 2021-10-03 NOTE — ED Provider Notes (Signed)
[facility] Provider Note  Patient Contact: 9:11 PM (approximate)   History   Back Pain   HPI  [name] is a 46 y.o. female presents to the emergency department with right-sided flank pain for the past 2 days.  Patient reports the pain stays mostly in her back.  Patient states that when she is driving, pain sometimes feels like it is radiating into her legs but does not currently feel that way.  She denies bowel or bladder incontinence or saddle anesthesia.  Denies experiencing similar pain like this in the past.  No fever or chills.  No heavy lifting at home.      Physical Exam   Triage Vital Signs: ED Triage Vitals  Enc Vitals Group     BP 10/03/21 1735 (!) 138/100     Pulse Rate 10/03/21 1735 87     Resp 10/03/21 1735 18     Temp 10/03/21 1735 98.5 F (36.9 C)     Temp Source 10/03/21 1735 Oral     SpO2 10/03/21 1735 97 %     Weight 10/03/21 1733 300 lb (136.1 kg)     Height 10/03/21 1733 5\' 4"  (1.626 m)     Head Circumference --      Peak Flow --      Pain Score 10/03/21 1732 10     Pain Loc --      Pain Edu? --      Excl. in [place]? --     Most recent vital signs: Vitals:   10/03/21 1735  BP: (!) 138/100  Pulse: 87  Resp: 18  Temp: 98.5 F (36.9 C)  SpO2: 97%     General: Alert and in no acute distress. Eyes:  PERRL. EOMI. Head: No acute traumatic findings ENT:      Ears:       Nose: No congestion/rhinnorhea.      Mouth/Throat: Mucous membranes are moist. Neck: No stridor. No cervical spine tenderness to palpation. Cardiovascular:  Good peripheral perfusion Respiratory: Normal respiratory effort without tachypnea or retractions. Lungs CTAB. Good air entry to the bases with no decreased or absent breath sounds. Gastrointestinal: Bowel sounds 4 quadrants. Soft and nontender to palpation. No guarding or rigidity. No palpable masses. No distention. Patient has CVA tenderness on the right.  Musculoskeletal: Full range of  motion to all extremities.  Neurologic:  No gross focal neurologic deficits are appreciated.  Skin:   No rash noted Other:   ED Results / Procedures / Treatments   Labs (all labs ordered are listed, but only abnormal results are displayed) Labs Reviewed  URINALYSIS, COMPLETE (UACMP) WITH MICROSCOPIC - Abnormal; Notable for the following components:      Result Value   Color, Urine YELLOW (*)    APPearance CLEAR (*)    Glucose, UA >=500 (*)    Hgb urine dipstick SMALL (*)    Bacteria, UA FEW (*)    All other components within normal limits  POC URINE PREG, ED        RADIOLOGY  I personally viewed and evaluated these images as part of my medical decision making, as well as reviewing the written report by the radiologist.  ED Provider Interpretation: I personally reviewed CT renal stone study.  There was no evidence of nephrolithiasis or acute abnormality of the lumbar spine.      MEDICATIONS ORDERED IN ED: Medications  oxyCODONE-acetaminophen (PERCOCET/ROXICET) 5-325 MG per tablet 1 tablet (1 tablet Oral Patient Refused/Not Given  10/03/21 2130)  ondansetron (ZOFRAN-ODT) disintegrating tablet 4 mg (4 mg Oral Patient Refused/Not Given 10/03/21 2130)  ketorolac (TORADOL) 30 MG/ML injection 30 mg (30 mg Intramuscular Given 10/03/21 2221)  methylPREDNISolone sodium succinate (SOLU-MEDROL) 125 mg/2 mL injection 125 mg (125 mg Intramuscular Given 10/03/21 2223)     IMPRESSION / MDM / ASSESSMENT AND PLAN / ED COURSE  I reviewed the triage vital signs and the nursing notes.                              Differential diagnosis includes, but is not limited to, nephrolithiasis, lumbar strain, lumbar radiculopathy...  Assessment and plan Low back pain 46 year old female presents to the emergency department with right-sided low back pain for the past 2 days.  Patient was mildly hypertensive at triage but vital signs were otherwise reassuring.  On physical exam, patient seemed  uncomfortable.  CT renal stone study showed no evidence of nephrolithiasis or acute abnormality of the lumbar spine   Patient already takes oxycodone 10 mg chronically for pain.  I offered injection of Toradol and Solu-Medrol and started patient on a taper of prednisone.   She was advised to follow-up with primary care as needed.  FINAL CLINICAL IMPRESSION(S) / ED DIAGNOSES   Final diagnoses:  Acute right-sided low back pain with right-sided sciatica     Rx / DC Orders   ED Discharge Orders          Ordered    predniSONE (STERAPRED UNI-PAK 21 TAB) 10 MG (21) TBPK tablet        10/03/21 2230    methocarbamol (ROBAXIN) 500 MG tablet  Every 8 hours PRN        10/03/21 2230             Note:  This document was prepared using Dragon voice recognition software and may include unintentional dictation errors.   Vallarie Mare [place], PA-C 10/03/21 2244    Duffy Bruce, MD 10/04/21 [phone]

## 2021-10-03 NOTE — ED Notes (Signed)
Pt discharge information reviewed. Pt understands need for follow up care and when to return if symptoms worsen. All questions answered. Pt is alert and oriented with even and regular respirations. Pt is brought out of department in wheelchair by family  °

## 2021-10-03 NOTE — ED Triage Notes (Addendum)
Pt states she is having back pain on the right side that radiates down the L leg- pt denied any urinary symptoms- pt states she recently was diagnosed with a pinched nerve in her back- pt states she has been having this pain since Monday- pt states she has tried multiple medications that she has at home for it but nothing is helping

## 2021-10-03 NOTE — ED Notes (Signed)
Pt visualized ambulatory from outside, ambulated up to first RN desk, states, "I need help, please I need help". This RN explained waiting for flex room.

## 2021-10-03 NOTE — ED Notes (Signed)
Patient declines PO medication, states "I take Percocet 10-325, and they have not been working."  Declines any nausea at this time.  PA, JW notified.

## 2021-10-03 NOTE — Discharge Instructions (Signed)
Take tapered steroid as directed. You can take muscle relaxer at night before bed.

## 2022-02-01 ENCOUNTER — Other Ambulatory Visit: Payer: Self-pay | Admitting: Nurse Practitioner

## 2022-02-01 ENCOUNTER — Ambulatory Visit
Admission: RE | Admit: 2022-02-01 | Discharge: 2022-02-01 | Disposition: A | Discharge status: Home / Self Care | Payer: Medicare Other | Attending: Nurse Practitioner | Admitting: Nurse Practitioner

## 2022-02-01 ENCOUNTER — Ambulatory Visit
Admission: RE | Admit: 2022-02-01 | Discharge: 2022-02-01 | Disposition: A | Discharge status: Home / Self Care | Payer: Medicare Other | Source: Ambulatory Visit | Attending: Nurse Practitioner | Admitting: Nurse Practitioner

## 2022-02-01 DIAGNOSIS — M170 Bilateral primary osteoarthritis of knee: Secondary | ICD-10-CM | POA: Insufficient documentation

## 2022-04-23 ENCOUNTER — Other Ambulatory Visit: Payer: Self-pay | Admitting: Internal Medicine

## 2022-04-23 DIAGNOSIS — D329 Benign neoplasm of meninges, unspecified: Secondary | ICD-10-CM

## 2022-05-13 ENCOUNTER — Ambulatory Visit
Admission: RE | Admit: 2022-05-13 | Discharge: 2022-05-13 | Disposition: A | Discharge status: Home / Self Care | Payer: Medicare Other | Source: Ambulatory Visit | Attending: Internal Medicine | Admitting: Internal Medicine

## 2022-05-13 DIAGNOSIS — D329 Benign neoplasm of meninges, unspecified: Secondary | ICD-10-CM

## 2022-05-13 MED ORDER — GADOBENATE DIMEGLUMINE 529 MG/ML IV SOLN
20.0000 mL | Freq: Once | INTRAVENOUS | Status: AC | PRN
  Administered 2022-05-13: 20 mL via INTRAVENOUS

## 2023-02-18 ENCOUNTER — Encounter: Payer: Self-pay | Admitting: Obstetrics and Gynecology

## 2023-03-07 ENCOUNTER — Other Ambulatory Visit: Payer: Self-pay | Admitting: Obstetrics and Gynecology

## 2023-03-07 DIAGNOSIS — Z1231 Encounter for screening mammogram for malignant neoplasm of breast: Secondary | ICD-10-CM

## 2023-03-15 ENCOUNTER — Ambulatory Visit
Admission: RE | Admit: 2023-03-15 | Discharge: 2023-03-15 | Disposition: A | Discharge status: Home / Self Care | Payer: Medicare Other | Source: Ambulatory Visit | Attending: Obstetrics and Gynecology | Admitting: Obstetrics and Gynecology

## 2023-03-15 DIAGNOSIS — Z1231 Encounter for screening mammogram for malignant neoplasm of breast: Secondary | ICD-10-CM | POA: Insufficient documentation

## 2023-04-12 ENCOUNTER — Other Ambulatory Visit: Payer: Self-pay | Admitting: Physical Medicine & Rehabilitation

## 2023-04-12 DIAGNOSIS — M542 Cervicalgia: Secondary | ICD-10-CM

## 2023-04-21 ENCOUNTER — Ambulatory Visit
Admission: RE | Admit: 2023-04-21 | Discharge: 2023-04-21 | Disposition: A | Discharge status: Home / Self Care | Payer: Medicare Other | Source: Ambulatory Visit | Attending: Physical Medicine & Rehabilitation | Admitting: Physical Medicine & Rehabilitation

## 2023-04-21 DIAGNOSIS — M542 Cervicalgia: Secondary | ICD-10-CM

## 2023-05-03 ENCOUNTER — Encounter: Payer: Self-pay | Admitting: Oncology

## 2023-05-03 ENCOUNTER — Inpatient Hospital Stay: Payer: Medicare Other | Attending: Oncology | Admitting: Oncology

## 2023-05-03 ENCOUNTER — Inpatient Hospital Stay: Payer: Medicare Other

## 2023-05-03 VITALS — BP 125/92 | HR 72 | Temp 97.0°F | Resp 18 | Wt 242.0 lb

## 2023-05-03 DIAGNOSIS — Z79899 Other long term (current) drug therapy: Secondary | ICD-10-CM | POA: Insufficient documentation

## 2023-05-03 DIAGNOSIS — Z139 Encounter for screening, unspecified: Secondary | ICD-10-CM | POA: Diagnosis not present

## 2023-05-03 DIAGNOSIS — D72829 Elevated white blood cell count, unspecified: Secondary | ICD-10-CM

## 2023-05-03 DIAGNOSIS — G4730 Sleep apnea, unspecified: Secondary | ICD-10-CM | POA: Diagnosis not present

## 2023-05-03 DIAGNOSIS — R5383 Other fatigue: Secondary | ICD-10-CM | POA: Insufficient documentation

## 2023-05-03 DIAGNOSIS — D751 Secondary polycythemia: Secondary | ICD-10-CM

## 2023-05-03 DIAGNOSIS — F17210 Nicotine dependence, cigarettes, uncomplicated: Secondary | ICD-10-CM | POA: Insufficient documentation

## 2023-05-03 LAB — CMP (CANCER CENTER ONLY)
ALT: 50 U/L — ABNORMAL HIGH (ref 0–44)
AST: 52 U/L — ABNORMAL HIGH (ref 15–41)
Albumin: 3.7 g/dL (ref 3.5–5.0)
Alkaline Phosphatase: 104 U/L (ref 38–126)
Anion gap: 8 (ref 5–15)
BUN: 13 mg/dL (ref 6–20)
CO2: 20 mmol/L — ABNORMAL LOW (ref 22–32)
Calcium: 8.6 mg/dL — ABNORMAL LOW (ref 8.9–10.3)
Chloride: 106 mmol/L (ref 98–111)
Creatinine: 0.62 mg/dL (ref 0.44–1.00)
GFR, Estimated: >60 mL/min (ref >60)
Glucose, Bld: 155 mg/dL — ABNORMAL HIGH (ref 70–99)
Potassium: 3.6 mmol/L (ref 3.5–5.1)
Sodium: 134 mmol/L — ABNORMAL LOW (ref 135–145)
Total Bilirubin: 0.5 mg/dL (ref 0.3–1.2)
Total Protein: 6.9 g/dL (ref 6.5–8.1)

## 2023-05-03 LAB — CBC WITH DIFFERENTIAL/PLATELET
Abs Immature Granulocytes: 0.02 10*3/uL (ref 0.00–0.07)
Basophils Absolute: 0 10*3/uL (ref 0.0–0.1)
Basophils Relative: 0 %
Eosinophils Absolute: 0 10*3/uL (ref 0.0–0.5)
Eosinophils Relative: 0 %
HCT: 46.8 % — ABNORMAL HIGH (ref 36.0–46.0)
Hemoglobin: 15.3 g/dL — ABNORMAL HIGH (ref 12.0–15.0)
Immature Granulocytes: 0 %
Lymphocytes Relative: 9 %
Lymphs Abs: 0.7 10*3/uL (ref 0.7–4.0)
MCH: 31.9 pg (ref 26.0–34.0)
MCHC: 32.7 g/dL (ref 30.0–36.0)
MCV: 97.7 fL (ref 80.0–100.0)
Monocytes Absolute: 0.4 10*3/uL (ref 0.1–1.0)
Monocytes Relative: 5 %
Neutro Abs: 6.7 10*3/uL (ref 1.7–7.7)
Neutrophils Relative %: 86 %
Platelets: 183 10*3/uL (ref 150–400)
RBC: 4.79 MIL/uL (ref 3.87–5.11)
RDW: 12.6 % (ref 11.5–15.5)
WBC: 7.9 10*3/uL (ref 4.0–10.5)
nRBC: 0 % (ref 0.0–0.2)

## 2023-05-03 LAB — FOLATE: Folate: 22 ng/mL (ref >5.9)

## 2023-05-03 LAB — TECHNOLOGIST SMEAR REVIEW: Plt Morphology: NORMAL

## 2023-05-03 LAB — LACTATE DEHYDROGENASE: LDH: 194 U/L — ABNORMAL HIGH (ref 98–192)

## 2023-05-03 NOTE — Assessment & Plan Note (Signed)
Labs are reviewed and discussed with patient.  Erythrocytosis is an abnormal elevation of hemoglobin and/or hematocrit in peripheral blood, and this can be caused by primary etiology, ie myeloproliferative disease, or secondary etiology, ie sleep apnea, smoking, testosterone etc  or familiar condition.  I will check erythropoietin, carbo monoxide level, JAK2 with reflex to other mutations, BCR-ABL1 FISH.    Likely the mild erythrocytosis due to smoking.

## 2023-05-03 NOTE — Assessment & Plan Note (Signed)
Likely reactive secondary to smoking. Check SPEP, light chain ratio, flow cytometry, LDH.  Check HIV, hepatitis panel.

## 2023-05-03 NOTE — Progress Notes (Signed)
Hematology/Oncology Consult note [phone] [phone]        REFERRING PROVIDER: Lauro Regulus, MD   CHIEF COMPLAINTS/REASON FOR VISIT:  Evaluation of erythrocytosis   ASSESSMENT & PLAN:   Erythrocytosis Labs are reviewed and discussed with patient.  Erythrocytosis is an abnormal elevation of hemoglobin and/or hematocrit in peripheral blood, and this can be caused by primary etiology, ie myeloproliferative disease, or secondary etiology, ie sleep apnea, smoking, testosterone etc  or familiar condition.  I will check erythropoietin, carbo monoxide level, JAK2 with reflex to other mutations, BCR-ABL1 FISH.    Likely the mild erythrocytosis due to smoking.  Leukocytosis Likely reactive secondary to smoking. Check SPEP, light chain ratio, flow cytometry, LDH.  Check HIV, hepatitis panel.   Orders Placed This Encounter  Procedures   JAK2 V617F rfx CALR/MPL/E12-15    Standing Status:   Future    Number of Occurrences:   1    Standing Expiration Date:   05/02/2024   BCR-ABL1 FISH    Standing Status:   Future    Number of Occurrences:   1    Standing Expiration Date:   05/02/2024   CBC with Differential/Platelet    Standing Status:   Future    Standing Expiration Date:   05/02/2024   CBC with Differential/Platelet    Standing Status:   Future    Number of Occurrences:   1    Standing Expiration Date:   05/02/2024   Flow cytometry panel-leukemia/lymphoma work-up    Standing Status:   Future    Number of Occurrences:   1    Standing Expiration Date:   05/02/2024   Lactate dehydrogenase    Standing Status:   Future    Number of Occurrences:   1    Standing Expiration Date:   05/02/2024   HIV Antibody (routine testing w rflx)    Standing Status:   Future    Number of Occurrences:   1    Standing Expiration Date:   05/02/2024   Hepatitis panel, acute    Standing Status:   Future    Number of Occurrences:   1    Standing Expiration Date:    05/02/2024   Kappa/lambda light chains    Standing Status:   Future    Number of Occurrences:   1    Standing Expiration Date:   05/02/2024   Multiple Myeloma Panel (SPEP&IFE w/QIG)    Standing Status:   Future    Number of Occurrences:   1    Standing Expiration Date:   05/02/2024   Vitamin B12    Standing Status:   Future    Number of Occurrences:   1    Standing Expiration Date:   05/02/2024   Folate    Standing Status:   Future    Number of Occurrences:   1    Standing Expiration Date:   05/02/2024   Erythropoietin    Standing Status:   Future    Number of Occurrences:   1    Standing Expiration Date:   05/02/2024   Carbon Monoxide, Blood    Standing Status:   Future    Number of Occurrences:   1    Standing Expiration Date:   05/02/2024   Technologist smear review    Standing Status:   Future    Number of Occurrences:   1    Standing Expiration Date:   05/02/2024    Order Specific Question:  Clinical information:    Answer:   erythrocytosi, leukocytosis   CMP (Cancer Center only)    Standing Status:   Future    Number of Occurrences:   1    Standing Expiration Date:   05/02/2024   Ambulatory referral to Social Work    Referral Priority:   Routine    Referral Type:   Consultation    Referral Reason:   Specialty Services Required    Number of Visits Requested:   1   Follow-up 3 to 4 weeks to go over results. All questions were answered. The patient knows to call the clinic with any problems, questions or concerns.  Rickard Patience, MD, PhD [facility] Hematology Oncology 05/03/2023   HISTORY OF PRESENTING ILLNESS:   [name] is a  47 y.o.  female with PMH listed below was seen in consultation at the request of  Lauro Regulus, MD  for evaluation of elevated hemoglobin.   02/08/2023 Cbc showed hemoglobin of 15.3, hct 46.1.  Patient reports feeling very tired.  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  2022 She also has chronic leukocytosis,  predominantly neutrophilia, dated back to at least 2015. Reviewed patient's previous labs.  elevated hemoglobin is chronic onset, dated back to  She denies intermittent headaches, shortness of breath on exertion, frequent leg cramps and occasional chest pain.  Patient denies  fever, chills, fatigue, night sweats.  Patient has had intentional weight loss. She denies history of blood clots.    Smoking history: Current everyday smoker.  She is motivated in smoke cessation.   History of sleep apnea, she reports being compliant using CPAP machine.  Patient has history of rheumatoid arthritis, currently on methotrexate.  There is some questionable history of lupus, per patient, her current rheumatologist does not think she has active lupus. History of endometriosis.  MEDICAL HISTORY:  Past Medical History:  Diagnosis Date   Anxiety    Arthritis    back, knees , neck and feet   Asthma    Cancer (HCC)    skin - basal cell on nose   Collagen vascular disease (HCC)    rheumatoid arthritis   COPD (chronic obstructive pulmonary disease) (HCC) 03/2017   mild   Coronary artery disease    Depression    Diabetes mellitus without complication (HCC)    Endometriosis    Fibromyalgia    Gastric ulcer    GERD (gastroesophageal reflux disease)    Headache    migraines   Hyperlipidemia    Hypertension    IBS (irritable bowel syndrome)    Lupus (HCC)    Myocardial infarction (HCC) 08/2009   Pneumonia    Sleep apnea    uses cpap   Tremors of nervous system    essential tremor is hands    SURGICAL HISTORY: Past Surgical History:  Procedure Laterality Date   ABDOMINAL HYSTERECTOMY     CARDIAC CATHETERIZATION  2010   stent x 1; 12/31/13 LHC ([facility] in [place]): 30-40% dLAD, mid LAD stent patent, 30% mLCX, 40% mRCA. Medical therapy.   CARPAL TUNNEL RELEASE Left 06/11/2020   Procedure: CARPAL TUNNEL RELEASE;  Surgeon: Bradly Bienenstock, MD;  Location: [facility] OR;  Service: Orthopedics;   Laterality: Left;  with local anesthesia   cataract surgery Bilateral    CESAREAN SECTION  2006   CHOLECYSTECTOMY  2008   COLONOSCOPY WITH PROPOFOL N/A 01/18/2018   Procedure: COLONOSCOPY WITH PROPOFOL;  Surgeon: Norma Fredrickson, Boykin Nearing, MD;  Location:  [facility] ENDOSCOPY;  Service: Gastroenterology;  Laterality: N/A;   DIAGNOSTIC LAPAROSCOPY  04/2000   DILATION AND CURETTAGE OF UTERUS  04/1997 missed ab   LAPAROSCOPIC OVARIAN CYSTECTOMY Right 2013   MULTIPLE TOOTH EXTRACTIONS  1998   4 wisdom teeth   UTERINE STENT PLACEMENT Bilateral 08/15/2017   Procedure: UTERINE STENT PLACEMENT;  Surgeon: Suzy Bouchard, MD;  Location: [facility] ORS;  Service: Gynecology;  Laterality: Bilateral;    SOCIAL HISTORY: Social History   Socioeconomic History   Marital status: Married    Spouse name: Not on file   Number of children: Not on file   Years of education: Not on file   Highest education level: Not on file  Occupational History   Not on file  Tobacco Use   Smoking status: Every Day    Current packs/day: 0.50    Average packs/day: 0.5 packs/day for 36.6 years (18.3 ttl pk-yrs)    Types: Cigarettes    Start date: 89   Smokeless tobacco: Never  Vaping Use   Vaping status: Never Used  Substance and Sexual Activity   Alcohol use: No   Drug use: No   Sexual activity: Not on file  Other Topics Concern   Not on file  Social History Narrative   Not on file   Social Determinants of Health   Financial Resource Strain: Not on file  Food Insecurity: Food Insecurity Present (05/03/2023)   Hunger Vital Sign    Worried About Running Out of Food in the Last Year: Often true    Ran Out of Food in the Last Year: Often true  Transportation Needs: No Transportation Needs (05/03/2023)   PRAPARE - Administrator, Civil Service (Medical): No    Lack of Transportation (Non-Medical): No  Physical Activity: Not on file  Stress: Not on file  Social Connections: Not on file  Intimate Partner  Violence: Not At Risk (05/03/2023)   Humiliation, Afraid, Rape, and Kick questionnaire    Fear of Current or Ex-Partner: No    Emotionally Abused: No    Physically Abused: No    Sexually Abused: No    FAMILY HISTORY: Family History  Problem Relation Age of Onset   Hypertension Mother    Cancer Mother        vulvar cancer   Non-Hodgkin's lymphoma Mother    Lung cancer Mother    Stroke Mother    Dementia Mother    Hypotension Father    Arthritis Father    Lung cancer Maternal Uncle    Breast cancer Cousin        2nd paternal side   BRCA 1/2 Neg Hx     ALLERGIES:  is allergic to glimepiride, avelox [moxifloxacin hcl in nacl], levaquin [levofloxacin in d5w], penicillins, sulfa antibiotics, gabapentin, macrobid [nitrofurantoin], and tetracyclines & related.  MEDICATIONS:  Current Outpatient Medications  Medication Sig Dispense Refill   acetaminophen (TYLENOL) 500 MG tablet Take 500 mg by mouth every 6 (six) hours as needed for mild pain or moderate pain.      albuterol (PROVENTIL HFA;VENTOLIN HFA) 108 (90 Base) MCG/ACT inhaler Inhale 2 puffs into the lungs every 6 (six) hours as needed for wheezing or shortness of breath.     aspirin EC 81 MG tablet Take 81 mg by mouth daily.     atorvastatin (LIPITOR) 80 MG tablet Take 80 mg by mouth at bedtime.     azelastine (ASTELIN) 0.1 % nasal spray Place 2 sprays into the nose daily  as needed for rhinitis or allergies.      cholecalciferol (VITAMIN D) 25 MCG (1000 UNIT) tablet Take 1,000 Units by mouth daily.      DULoxetine (CYMBALTA) 60 MG capsule Take 60 mg by mouth 2 (two) times daily.  0   EMGALITY 120 MG/ML SOAJ Inject 120 mg as directed every 30 (thirty) days.     FARXIGA 10 MG TABS tablet Take 10 mg by mouth every morning.     folic acid (FOLVITE) 1 MG tablet Take 1 mg by mouth daily.      HUMIRA, 2 PEN, 40 MG/0.4ML pen Take 40 mg by mouth every 14 (fourteen) days.     ibuprofen (ADVIL,MOTRIN) 800 MG tablet Take 1 tablet (800 mg  total) by mouth 3 (three) times daily as needed for moderate pain. (Patient taking differently: Take 800 mg by mouth 3 (three) times daily as needed (inflammation).) 30 tablet 0   lactulose (CHRONULAC) 10 GM/15ML solution Take 10 g by mouth daily as needed for mild constipation.     lidocaine 4 % Place 1 patch onto the skin daily.     methotrexate (RHEUMATREX) 2.5 MG tablet Take 25 mg by mouth once a week.     metoprolol tartrate (LOPRESSOR) 50 MG tablet Take 50 mg by mouth 2 (two) times daily.     montelukast (SINGULAIR) 10 MG tablet Take 10 mg by mouth at bedtime.     NURTEC 75 MG TBDP Take 1 tablet by mouth daily as needed for migraine.     nystatin (MYCOSTATIN/NYSTOP) powder Apply 1 application topically daily as needed for rash.     nystatin ointment (MYCOSTATIN) Apply 1 Application topically 2 (two) times daily.     oxyCODONE-acetaminophen (PERCOCET) 10-325 MG tablet Take 1 tablet by mouth every 4 (four) hours as needed for pain.     pantoprazole (PROTONIX) 40 MG tablet Take 40 mg by mouth daily.     pregabalin (LYRICA) 50 MG capsule Take 50 mg by mouth daily.     promethazine (PHENERGAN) 25 MG tablet Take 25 mg by mouth daily as needed for nausea.     ranolazine (RANEXA) 500 MG 12 hr tablet Take 1 tablet by mouth 2 (two) times daily.     rizatriptan (MAXALT) 10 MG tablet Take 10 mg by mouth as needed for migraine.      tirzepatide [facility]) 5 MG/0.5ML Pen Inject 5 mg into the skin once a week.     traZODone (DESYREL) 100 MG tablet Take 100 mg by mouth at bedtime.     varenicline (CHANTIX) 0.5 MG tablet Start with 1 tablet with food for 2 weeks int he morning. Then increase to 2 tablets in the morning with food.     COMBIVENT RESPIMAT 20-100 MCG/ACT AERS respimat Inhale 1 puff into the lungs daily as needed for wheezing or shortness of breath.  (Patient not taking: Reported on 05/03/2023)     diphenhydrAMINE (BENADRYL) 25 MG tablet Take 25 mg by mouth every 6 (six) hours as needed for  allergies.  (Patient not taking: Reported on 05/03/2023)     No current facility-administered medications for this visit.    Review of Systems  Constitutional:  Positive for fatigue. Negative for appetite change, chills and fever.  HENT:   Negative for hearing loss and voice change.   Eyes:  Negative for eye problems.  Respiratory:  Negative for chest tightness and cough.   Cardiovascular:  Negative for chest pain.  Gastrointestinal:  Negative for abdominal  distention, abdominal pain and blood in stool.  Endocrine: Negative for hot flashes.  Genitourinary:  Negative for difficulty urinating and frequency.   Musculoskeletal:  Negative for arthralgias.  Skin:  Negative for itching and rash.  Neurological:  Negative for extremity weakness.  Hematological:  Negative for adenopathy.  Psychiatric/Behavioral:  Negative for confusion.     PHYSICAL EXAMINATION: ECOG PERFORMANCE STATUS: 1 - Symptomatic but completely ambulatory Vitals:   05/03/23 1133  BP: (!) 125/92  Pulse: 72  Resp: 18  Temp: (!) 97 F (36.1 C)   Filed Weights   05/03/23 1133  Weight: 242 lb (109.8 kg)   Physical Exam Constitutional:      General: She is not in acute distress. HENT:     Head: Normocephalic and atraumatic.  Eyes:     General: No scleral icterus. Cardiovascular:     Rate and Rhythm: Normal rate and regular rhythm.     Heart sounds: Normal heart sounds.  Pulmonary:     Effort: Pulmonary effort is normal. No respiratory distress.     Breath sounds: No wheezing.  Abdominal:     General: Bowel sounds are normal. There is no distension.     Palpations: Abdomen is soft.  Musculoskeletal:        General: No deformity. Normal range of motion.     Cervical back: Normal range of motion and neck supple.  Skin:    General: Skin is warm and dry.     Findings: No erythema or rash.  Neurological:     Mental Status: She is alert and oriented to person, place, and time. Mental status is at baseline.      Cranial Nerves: No cranial nerve deficit.     Coordination: Coordination normal.  Psychiatric:        Mood and Affect: Mood normal.     LABORATORY DATA:  I have reviewed the data as listed    Latest Ref Rng & Units 05/03/2023   12:03 PM 06/11/2020    2:00 PM 10/20/2019    9:28 PM  CBC  WBC 4.0 - 10.5 K/uL 7.9  13.4  13.0   Hemoglobin 12.0 - 15.0 g/dL 40.9  81.1  91.4   Hematocrit 36.0 - 46.0 % 46.8  46.2  45.9   Platelets 150 - 400 K/uL 183  246  262       Latest Ref Rng & Units 05/03/2023   12:03 PM 06/11/2020    2:00 PM 10/20/2019    9:28 PM  CMP  Glucose 70 - 99 mg/dL 782  956  213   BUN 6 - 20 mg/dL 13  10  17    Creatinine 0.44 - 1.00 mg/dL 0.86  5.78  4.69   Sodium 135 - 145 mmol/L 134  137  144   Potassium 3.5 - 5.1 mmol/L 3.6  3.6  4.2   Chloride 98 - 111 mmol/L 106  107  102   CO2 22 - 32 mmol/L 20  23  25    Calcium 8.9 - 10.3 mg/dL 8.6  9.1  9.6   Total Protein 6.5 - 8.1 g/dL 6.9     Total Bilirubin 0.3 - 1.2 mg/dL 0.5     Alkaline Phos 38 - 126 U/L 104     AST 15 - 41 U/L 52     ALT 0 - 44 U/L 50         RADIOGRAPHIC STUDIES: I have personally reviewed the radiological images as listed and agreed with  the findings in the report. MR CERVICAL SPINE WO CONTRAST  Result Date: 04/27/2023 CLINICAL DATA:  Initial evaluation for chronic neck pain radiating to the right shoulder. EXAM: MRI CERVICAL SPINE WITHOUT CONTRAST TECHNIQUE: Multiplanar, multisequence MR imaging of the cervical spine was performed. No intravenous contrast was administered. COMPARISON:  Prior study from 03/31/2018. FINDINGS: Alignment: Vertebral bodies normally aligned with preservation of the normal cervical lordosis. Underlying levoscoliosis. No significant listhesis. Vertebrae: Vertebral body height maintained without acute or chronic fracture. Bone marrow signal intensity within normal limits. Subcentimeter benign hemangioma noted within the T2 vertebral body. No worrisome osseous lesions. No  significant abnormal marrow edema. Cord: Normal signal and morphology. Posterior Fossa, vertebral arteries, paraspinal tissues: Unremarkable. Disc levels: C2-C3: Normal interspace. Moderate right-sided facet arthrosis. No spinal stenosis. Foramina remain patent. C3-C4: Minimal annular disc bulge. Moderate right with mild left facet arthrosis. No spinal stenosis. Foramina remain patent. C4-C5: Minimal annular disc bulge. Moderate bilateral facet hypertrophy. No spinal stenosis. Foramina remain patent. C5-C6: Mild disc bulge with superimposed tiny left paracentral disc protrusion. Mild uncovertebral spurring. Minimal flattening of the ventral thecal sac without significant spinal stenosis. Moderate bilateral facet hypertrophy. No significant spinal stenosis. Foramina remain patent. C6-C7: Minimal disc bulge with endplate spurring. Moderate left worse than right facet arthrosis. No spinal stenosis. Foramina remain patent. C7-T1: Normal interspace. Mild to moderate facet hypertrophy. No spinal stenosis. Foramina remain patent. IMPRESSION: 1. 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 Findings could contribute to neck pain and referred symptoms. 2. Minor for age noncompressive disc bulging at C3-4 through C6-7 without stenosis or impingement. Electronically Signed   By: Rise Mu M.D.   On: 04/27/2023 18:10   MM 3D SCREENING MAMMOGRAM BILATERAL BREAST  Result Date: 03/16/2023 CLINICAL DATA:  Screening. EXAM: DIGITAL SCREENING BILATERAL MAMMOGRAM WITH TOMOSYNTHESIS AND CAD TECHNIQUE: Bilateral screening digital craniocaudal and mediolateral oblique mammograms were obtained. Bilateral screening digital breast tomosynthesis was performed. The images were evaluated with computer-aided detection. COMPARISON:  Previous exam(s). ACR Breast Density Category b: There are scattered areas of fibroglandular density. FINDINGS: There are no findings suspicious  for malignancy. IMPRESSION: No mammographic evidence of malignancy. A result letter of this screening mammogram will be mailed directly to the patient. RECOMMENDATION: Screening mammogram in one year. (Code:SM-B-01Y) BI-RADS CATEGORY  1: Negative. Electronically Signed   By: Bary Richard M.D.   On: 03/16/2023 15:34

## 2023-05-04 LAB — ERYTHROPOIETIN: Erythropoietin: 4.9 m[IU]/mL (ref 2.6–18.5)

## 2023-05-04 LAB — KAPPA/LAMBDA LIGHT CHAINS
Kappa free light chain: 21.9 mg/L — ABNORMAL HIGH (ref 3.3–19.4)
Kappa, lambda light chain ratio: 0.88 (ref 0.26–1.65)
Lambda free light chains: 24.8 mg/L (ref 5.7–26.3)

## 2023-05-04 LAB — HEPATITIS PANEL, ACUTE
HCV Ab: NONREACTIVE
Hep A IgM: NONREACTIVE
Hep B C IgM: NONREACTIVE
Hepatitis B Surface Ag: NONREACTIVE

## 2023-05-04 LAB — CARBON MONOXIDE, BLOOD (PERFORMED AT REF LAB): Carbon Monoxide, Blood: 7.5 % — ABNORMAL HIGH (ref 0.0–3.6)

## 2023-05-04 LAB — HIV ANTIBODY (ROUTINE TESTING W REFLEX): HIV Screen 4th Generation wRfx: NONREACTIVE

## 2023-05-04 LAB — VITAMIN B12: Vitamin B-12: 460 pg/mL (ref 180–914)

## 2023-05-06 ENCOUNTER — Inpatient Hospital Stay: Payer: Medicare Other

## 2023-05-06 LAB — BCR-ABL1 FISH
Cells Analyzed: 200
Cells Counted: 200

## 2023-05-06 NOTE — Progress Notes (Signed)
[facility] Clinical Social Work  Clinical Social Work was referred by medical provider for assessment of psychosocial needs.  Clinical Social Worker contacted patient by phone to offer support and assess for needs.    Patient declined food pantry list at this time.  CSW contacted [employer] resources for [facility] residents and will forward to patient once received.  She reports currently having COVID.  She receives SSD, but is not eligible for Food Stamps due to her husband's income.     Dorothey Baseman, LCSW  Clinical Social Worker [facility]

## 2023-05-07 LAB — COMP PANEL: LEUKEMIA/LYMPHOMA

## 2023-05-08 LAB — JAK2 V617F RFX CALR/MPL/E12-15

## 2023-05-08 LAB — MULTIPLE MYELOMA PANEL, SERUM
Albumin SerPl Elph-Mcnc: 3.4 g/dL (ref 2.9–4.4)
Albumin/Glob SerPl: 1.3 (ref 0.7–1.7)
Alpha 1: 0.3 g/dL (ref 0.0–0.4)
Alpha2 Glob SerPl Elph-Mcnc: 0.8 g/dL (ref 0.4–1.0)
B-Globulin SerPl Elph-Mcnc: 1 g/dL (ref 0.7–1.3)
Gamma Glob SerPl Elph-Mcnc: 0.7 g/dL (ref 0.4–1.8)
Globulin, Total: 2.8 g/dL (ref 2.2–3.9)
IgA: 196 mg/dL (ref 87–352)
IgG (Immunoglobin G), Serum: 692 mg/dL (ref 586–1602)
IgM (Immunoglobulin M), Srm: 193 mg/dL (ref 26–217)
Total Protein ELP: 6.2 g/dL (ref 6.0–8.5)

## 2023-05-08 LAB — CALR +MPL + E12-E15  (REFLEX)

## 2023-05-24 ENCOUNTER — Inpatient Hospital Stay: Payer: Medicare Other | Admitting: Oncology

## 2023-06-14 ENCOUNTER — Encounter: Payer: Self-pay | Admitting: Oncology

## 2023-06-14 ENCOUNTER — Inpatient Hospital Stay: Payer: Medicare Other | Attending: Oncology | Admitting: Oncology

## 2023-06-14 VITALS — BP 155/104 | HR 74 | Temp 96.0°F | Resp 18 | Wt 240.1 lb

## 2023-06-14 DIAGNOSIS — Z79631 Long term (current) use of antimetabolite agent: Secondary | ICD-10-CM | POA: Diagnosis not present

## 2023-06-14 DIAGNOSIS — D751 Secondary polycythemia: Secondary | ICD-10-CM | POA: Diagnosis present

## 2023-06-14 DIAGNOSIS — Z79899 Other long term (current) drug therapy: Secondary | ICD-10-CM | POA: Diagnosis not present

## 2023-06-14 DIAGNOSIS — D72829 Elevated white blood cell count, unspecified: Secondary | ICD-10-CM | POA: Insufficient documentation

## 2023-06-14 DIAGNOSIS — Z7982 Long term (current) use of aspirin: Secondary | ICD-10-CM | POA: Insufficient documentation

## 2023-06-14 DIAGNOSIS — F17210 Nicotine dependence, cigarettes, uncomplicated: Secondary | ICD-10-CM | POA: Diagnosis not present

## 2023-06-14 DIAGNOSIS — Z720 Tobacco use: Secondary | ICD-10-CM | POA: Insufficient documentation

## 2023-06-14 NOTE — Assessment & Plan Note (Signed)
Encourage patient cessation effort

## 2023-06-14 NOTE — Progress Notes (Signed)
Hematology/Oncology Consult note [phone] [phone]        REFERRING PROVIDER: Lauro Regulus, MD   CHIEF COMPLAINTS/REASON FOR VISIT:  secondary erythrocytosis  ASSESSMENT & PLAN:   Erythrocytosis Labs are reviewed and discussed with patient.  Patient has normal erythropoietin level, elevated, carbo monoxide level, negative JAK2 with reflex to other mutations, negative BCR-ABL1 FISH.    Likely the mild erythrocytosis due to smoking. Hematocrit is less than 52, no need for phlebotomy.  Leukocytosis Likely reactive secondary to smoking. Patient has negative peripheral blood flow cytometry, no M protein on protein electrophoresis.  Elevated LDH is likely secondary to her autoimmune disorders -fibromyalgia and lupus.  Tobacco use Encourage patient cessation effort.   No orders of the defined types were placed in this encounter.  Follow-up as needed All questions were answered. The patient knows to call the clinic with any problems, questions or concerns.  Rickard Patience, MD, PhD [facility] Hematology Oncology 06/14/2023   HISTORY OF PRESENTING ILLNESS:   [name] is a  47 y.o.  female with PMH listed below was seen in consultation at the request of  Lauro Regulus, MD  for evaluation of elevated hemoglobin.   02/08/2023 Cbc showed hemoglobin of 15.3, hct 46.1.  Patient reports feeling very tired.  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  2022 She also has chronic leukocytosis, predominantly neutrophilia, dated back to at least 2015. Reviewed patient's previous labs.  elevated hemoglobin is chronic onset, dated back to  She denies intermittent headaches, shortness of breath on exertion, frequent leg cramps and occasional chest pain.  Patient denies  fever, chills, fatigue, night sweats.  Patient has had intentional weight loss. She denies history of blood clots.    Smoking history: Current everyday smoker.  She is  motivated in smoke cessation.   History of sleep apnea, she reports being compliant using CPAP machine.  Patient has history of rheumatoid arthritis, currently on methotrexate.  There is some questionable history of lupus, per patient, her current rheumatologist does not think she has active lupus. History of endometriosis.   INTERVAL HISTORY [name] is a 47 y.o. female who has above history reviewed by me today presents for follow up visit for secondary erythrocytosis.  Patient had a blood work done and present to discuss results.  MEDICAL HISTORY:  Past Medical History:  Diagnosis Date   Anxiety    Arthritis    back, knees , neck and feet   Asthma    Cancer (HCC)    skin - basal cell on nose   Collagen vascular disease (HCC)    rheumatoid arthritis   COPD (chronic obstructive pulmonary disease) (HCC) 03/2017   mild   Coronary artery disease    Depression    Diabetes mellitus without complication (HCC)    Endometriosis    Fibromyalgia    Gastric ulcer    GERD (gastroesophageal reflux disease)    Headache    migraines   Hyperlipidemia    Hypertension    IBS (irritable bowel syndrome)    Lupus    Myocardial infarction (HCC) 08/2009   Pneumonia    Sleep apnea    uses cpap   Tremors of nervous system    essential tremor is hands    SURGICAL HISTORY: Past Surgical History:  Procedure Laterality Date   ABDOMINAL HYSTERECTOMY     CARDIAC CATHETERIZATION  2010   stent x 1; 12/31/13 [facility] ([facility] in [place]): 30-40% dLAD,  mid LAD stent patent, 30% mLCX, 40% mRCA. Medical therapy.   CARPAL TUNNEL RELEASE Left 06/11/2020   Procedure: CARPAL TUNNEL RELEASE;  Surgeon: Bradly Bienenstock, MD;  Location: [facility] OR;  Service: Orthopedics;  Laterality: Left;  with local anesthesia   cataract surgery Bilateral    CESAREAN SECTION  2006   CHOLECYSTECTOMY  2008   COLONOSCOPY WITH PROPOFOL N/A 01/18/2018   Procedure: COLONOSCOPY WITH PROPOFOL;  Surgeon: Toledo, Boykin Nearing,  MD;  Location: [facility] ENDOSCOPY;  Service: Gastroenterology;  Laterality: N/A;   DIAGNOSTIC LAPAROSCOPY  04/2000   DILATION AND CURETTAGE OF UTERUS  04/1997 missed ab   LAPAROSCOPIC OVARIAN CYSTECTOMY Right 2013   MULTIPLE TOOTH EXTRACTIONS  1998   4 wisdom teeth   UTERINE STENT PLACEMENT Bilateral 08/15/2017   Procedure: UTERINE STENT PLACEMENT;  Surgeon: Suzy Bouchard, MD;  Location: [facility] ORS;  Service: Gynecology;  Laterality: Bilateral;    SOCIAL HISTORY: Social History   Socioeconomic History   Marital status: Married    Spouse name: Not on file   Number of children: Not on file   Years of education: Not on file   Highest education level: Not on file  Occupational History   Not on file  Tobacco Use   Smoking status: Every Day    Current packs/day: 0.50    Average packs/day: 0.5 packs/day for 36.7 years (18.4 ttl pk-yrs)    Types: Cigarettes    Start date: 27   Smokeless tobacco: Never  Vaping Use   Vaping status: Never Used  Substance and Sexual Activity   Alcohol use: No   Drug use: No   Sexual activity: Not on file  Other Topics Concern   Not on file  Social History Narrative   Not on file   Social Determinants of Health   Financial Resource Strain: Not on file  Food Insecurity: Food Insecurity Present (05/03/2023)   Hunger Vital Sign    Worried About Running Out of Food in the Last Year: Often true    Ran Out of Food in the Last Year: Often true  Transportation Needs: No Transportation Needs (05/03/2023)   PRAPARE - Administrator, Civil Service (Medical): No    Lack of Transportation (Non-Medical): No  Physical Activity: Not on file  Stress: Not on file  Social Connections: Not on file  Intimate Partner Violence: Not At Risk (05/03/2023)   Humiliation, Afraid, Rape, and Kick questionnaire    Fear of Current or Ex-Partner: No    Emotionally Abused: No    Physically Abused: No    Sexually Abused: No    FAMILY HISTORY: Family History   Problem Relation Age of Onset   Hypertension Mother    Cancer Mother        vulvar cancer   Non-Hodgkin's lymphoma Mother    Lung cancer Mother    Stroke Mother    Dementia Mother    Hypotension Father    Arthritis Father    Lung cancer Maternal Uncle    Breast cancer Cousin        2nd paternal side   BRCA 1/2 Neg Hx     ALLERGIES:  is allergic to glimepiride, avelox [moxifloxacin hcl in nacl], levaquin [levofloxacin in d5w], penicillins, sulfa antibiotics, gabapentin, macrobid [nitrofurantoin], and tetracyclines & related.  MEDICATIONS:  Current Outpatient Medications  Medication Sig Dispense Refill   acetaminophen (TYLENOL) 500 MG tablet Take 500 mg by mouth every 6 (six) hours as needed for mild pain or moderate  pain.      albuterol (PROVENTIL HFA;VENTOLIN HFA) 108 (90 Base) MCG/ACT inhaler Inhale 2 puffs into the lungs every 6 (six) hours as needed for wheezing or shortness of breath.     aspirin EC 81 MG tablet Take 81 mg by mouth daily.     atorvastatin (LIPITOR) 80 MG tablet Take 80 mg by mouth at bedtime.     azelastine (ASTELIN) 0.1 % nasal spray Place 2 sprays into the nose daily as needed for rhinitis or allergies.      cholecalciferol (VITAMIN D) 25 MCG (1000 UNIT) tablet Take 1,000 Units by mouth daily.      DULoxetine (CYMBALTA) 60 MG capsule Take 60 mg by mouth 2 (two) times daily.  0   EMGALITY 120 MG/ML SOAJ Inject 120 mg as directed every 30 (thirty) days.     FARXIGA 10 MG TABS tablet Take 10 mg by mouth every morning.     folic acid (FOLVITE) 1 MG tablet Take 1 mg by mouth daily.      HUMIRA, 2 PEN, 40 MG/0.4ML pen Take 40 mg by mouth every 14 (fourteen) days.     ibuprofen (ADVIL,MOTRIN) 800 MG tablet Take 1 tablet (800 mg total) by mouth 3 (three) times daily as needed for moderate pain. (Patient taking differently: Take 800 mg by mouth 3 (three) times daily as needed (inflammation).) 30 tablet 0   lactulose (CHRONULAC) 10 GM/15ML solution Take 10 g by mouth  daily as needed for mild constipation.     lidocaine 4 % Place 1 patch onto the skin daily.     methotrexate (RHEUMATREX) 2.5 MG tablet Take 25 mg by mouth once a week.     metoprolol tartrate (LOPRESSOR) 50 MG tablet Take 50 mg by mouth 2 (two) times daily.     montelukast (SINGULAIR) 10 MG tablet Take 10 mg by mouth at bedtime.     NURTEC 75 MG TBDP Take 1 tablet by mouth daily as needed for migraine.     nystatin (MYCOSTATIN/NYSTOP) powder Apply 1 application topically daily as needed for rash.     nystatin ointment (MYCOSTATIN) Apply 1 Application topically 2 (two) times daily.     oxyCODONE-acetaminophen (PERCOCET) 10-325 MG tablet Take 1 tablet by mouth every 4 (four) hours as needed for pain.     pantoprazole (PROTONIX) 40 MG tablet Take 40 mg by mouth daily.     pregabalin (LYRICA) 50 MG capsule Take 50 mg by mouth daily.     promethazine (PHENERGAN) 25 MG tablet Take 25 mg by mouth daily as needed for nausea.     ranolazine (RANEXA) 500 MG 12 hr tablet Take 1 tablet by mouth 2 (two) times daily.     rizatriptan (MAXALT) 10 MG tablet Take 10 mg by mouth as needed for migraine.      tirzepatide [facility]) 5 MG/0.5ML Pen Inject 5 mg into the skin once a week.     traZODone (DESYREL) 100 MG tablet Take 100 mg by mouth at bedtime.     varenicline (CHANTIX) 0.5 MG tablet Start with 1 tablet with food for 2 weeks int he morning. Then increase to 2 tablets in the morning with food.     COMBIVENT RESPIMAT 20-100 MCG/ACT AERS respimat Inhale 1 puff into the lungs daily as needed for wheezing or shortness of breath.  (Patient not taking: Reported on 05/03/2023)     diphenhydrAMINE (BENADRYL) 25 MG tablet Take 25 mg by mouth every 6 (six) hours as needed for  allergies.  (Patient not taking: Reported on 05/03/2023)     No current facility-administered medications for this visit.    Review of Systems  Constitutional:  Positive for fatigue. Negative for appetite change, chills and fever.  HENT:    Negative for hearing loss and voice change.   Eyes:  Negative for eye problems.  Respiratory:  Negative for chest tightness and cough.   Cardiovascular:  Negative for chest pain.  Gastrointestinal:  Negative for abdominal distention, abdominal pain and blood in stool.  Endocrine: Negative for hot flashes.  Genitourinary:  Negative for difficulty urinating and frequency.   Musculoskeletal:  Negative for arthralgias.  Skin:  Negative for itching and rash.  Neurological:  Negative for extremity weakness.  Hematological:  Negative for adenopathy.  Psychiatric/Behavioral:  Negative for confusion.     PHYSICAL EXAMINATION: ECOG PERFORMANCE STATUS: 1 - Symptomatic but completely ambulatory Vitals:   06/14/23 1104 06/14/23 1117  BP: (!) 168/101 (!) 155/104  Pulse: 74   Resp: 18   Temp: (!) 96 F (35.6 C)   SpO2: 100%    Filed Weights   06/14/23 1104  Weight: 240 lb 1.6 oz (108.9 kg)   Physical Exam Constitutional:      General: She is not in acute distress. HENT:     Head: Normocephalic and atraumatic.  Eyes:     General: No scleral icterus. Cardiovascular:     Rate and Rhythm: Normal rate and regular rhythm.     Heart sounds: Normal heart sounds.  Pulmonary:     Effort: Pulmonary effort is normal. No respiratory distress.     Breath sounds: No wheezing.  Abdominal:     General: Bowel sounds are normal. There is no distension.     Palpations: Abdomen is soft.  Musculoskeletal:        General: No deformity. Normal range of motion.     Cervical back: Normal range of motion and neck supple.  Skin:    General: Skin is warm and dry.     Findings: No erythema or rash.  Neurological:     Mental Status: She is alert and oriented to person, place, and time. Mental status is at baseline.     Cranial Nerves: No cranial nerve deficit.     Coordination: Coordination normal.  Psychiatric:        Mood and Affect: Mood normal.     LABORATORY DATA:  I have reviewed the data as  listed    Latest Ref Rng & Units 05/03/2023   12:03 PM 06/11/2020    2:00 PM 10/20/2019    9:28 PM  CBC  WBC 4.0 - 10.5 K/uL 7.9  13.4  13.0   Hemoglobin 12.0 - 15.0 g/dL 65.7  84.6  96.2   Hematocrit 36.0 - 46.0 % 46.8  46.2  45.9   Platelets 150 - 400 K/uL 183  246  262       Latest Ref Rng & Units 05/03/2023   12:03 PM 06/11/2020    2:00 PM 10/20/2019    9:28 PM  CMP  Glucose 70 - 99 mg/dL 952  841  324   BUN 6 - 20 mg/dL 13  10  17    Creatinine 0.44 - 1.00 mg/dL 4.01  0.27  2.53   Sodium 135 - 145 mmol/L 134  137  144   Potassium 3.5 - 5.1 mmol/L 3.6  3.6  4.2   Chloride 98 - 111 mmol/L 106  107  102   CO2  22 - 32 mmol/L 20  23  25    Calcium 8.9 - 10.3 mg/dL 8.6  9.1  9.6   Total Protein 6.5 - 8.1 g/dL 6.9     Total Bilirubin 0.3 - 1.2 mg/dL 0.5     Alkaline Phos 38 - 126 U/L 104     AST 15 - 41 U/L 52     ALT 0 - 44 U/L 50         RADIOGRAPHIC STUDIES: I have personally reviewed the radiological images as listed and agreed with the findings in the report. MR CERVICAL SPINE WO CONTRAST  Result Date: 04/27/2023 CLINICAL DATA:  Initial evaluation for chronic neck pain radiating to the right shoulder. EXAM: MRI CERVICAL SPINE WITHOUT CONTRAST TECHNIQUE: Multiplanar, multisequence MR imaging of the cervical spine was performed. No intravenous contrast was administered. COMPARISON:  Prior study from 03/31/2018. FINDINGS: Alignment: Vertebral bodies normally aligned with preservation of the normal cervical lordosis. Underlying levoscoliosis. No significant listhesis. Vertebrae: Vertebral body height maintained without acute or chronic fracture. Bone marrow signal intensity within normal limits. Subcentimeter benign hemangioma noted within the T2 vertebral body. No worrisome osseous lesions. No significant abnormal marrow edema. Cord: Normal signal and morphology. Posterior Fossa, vertebral arteries, paraspinal tissues: Unremarkable. Disc levels: C2-C3: Normal interspace. Moderate  right-sided facet arthrosis. No spinal stenosis. Foramina remain patent. C3-C4: Minimal annular disc bulge. Moderate right with mild left facet arthrosis. No spinal stenosis. Foramina remain patent. C4-C5: Minimal annular disc bulge. Moderate bilateral facet hypertrophy. No spinal stenosis. Foramina remain patent. C5-C6: Mild disc bulge with superimposed tiny left paracentral disc protrusion. Mild uncovertebral spurring. Minimal flattening of the ventral thecal sac without significant spinal stenosis. Moderate bilateral facet hypertrophy. No significant spinal stenosis. Foramina remain patent. C6-C7: Minimal disc bulge with endplate spurring. Moderate left worse than right facet arthrosis. No spinal stenosis. Foramina remain patent. C7-T1: Normal interspace. Mild to moderate facet hypertrophy. No spinal stenosis. Foramina remain patent. IMPRESSION: 1. 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 Findings could contribute to neck pain and referred symptoms. 2. Minor for age noncompressive disc bulging at C3-4 through C6-7 without stenosis or impingement. Electronically Signed   By: Rise Mu M.D.   On: 04/27/2023 18:10

## 2023-06-14 NOTE — Assessment & Plan Note (Signed)
Likely reactive secondary to smoking. Patient has negative peripheral blood flow cytometry, no M protein on protein electrophoresis.  Elevated LDH is likely secondary to her autoimmune disorders -fibromyalgia and lupus.

## 2023-06-14 NOTE — Assessment & Plan Note (Addendum)
Labs are reviewed and discussed with patient.  Patient has normal erythropoietin level, elevated, carbo monoxide level, negative JAK2 with reflex to other mutations, negative BCR-ABL1 FISH.    Likely the mild erythrocytosis due to smoking. Hematocrit is less than 52, no need for phlebotomy.

## 2023-07-18 ENCOUNTER — Other Ambulatory Visit
Admission: RE | Admit: 2023-07-18 | Discharge: 2023-07-18 | Disposition: A | Discharge status: Home / Self Care | Payer: Medicare Other | Source: Ambulatory Visit | Attending: Internal Medicine | Admitting: Internal Medicine

## 2023-07-18 DIAGNOSIS — R0602 Shortness of breath: Secondary | ICD-10-CM | POA: Insufficient documentation

## 2023-07-18 DIAGNOSIS — R5383 Other fatigue: Secondary | ICD-10-CM | POA: Diagnosis present

## 2023-07-18 LAB — TROPONIN I (HIGH SENSITIVITY): Troponin I (High Sensitivity): 7 ng/L (ref <18)

## 2023-07-18 LAB — D-DIMER, QUANTITATIVE: D-Dimer, Quant: 0.79 ug{FEU}/mL — ABNORMAL HIGH (ref 0.00–0.50)

## 2023-07-19 ENCOUNTER — Other Ambulatory Visit: Payer: Self-pay | Admitting: Internal Medicine

## 2023-07-19 ENCOUNTER — Ambulatory Visit
Admission: RE | Admit: 2023-07-19 | Discharge: 2023-07-19 | Disposition: A | Discharge status: Home / Self Care | Payer: Medicare Other | Source: Ambulatory Visit | Attending: Internal Medicine | Admitting: Internal Medicine

## 2023-07-19 DIAGNOSIS — R7989 Other specified abnormal findings of blood chemistry: Secondary | ICD-10-CM | POA: Diagnosis present

## 2023-07-19 DIAGNOSIS — R0602 Shortness of breath: Secondary | ICD-10-CM

## 2023-07-19 MED ORDER — IOHEXOL 350 MG/ML SOLN
75.0000 mL | Freq: Once | INTRAVENOUS | Status: AC | PRN
  Administered 2023-07-19: 75 mL via INTRAVENOUS

## 2023-08-17 ENCOUNTER — Other Ambulatory Visit: Payer: Self-pay | Admitting: Internal Medicine

## 2023-08-17 DIAGNOSIS — I422 Other hypertrophic cardiomyopathy: Secondary | ICD-10-CM

## 2023-10-17 ENCOUNTER — Encounter (HOSPITAL_COMMUNITY): Payer: Self-pay

## 2023-10-19 ENCOUNTER — Other Ambulatory Visit: Payer: Self-pay | Admitting: Internal Medicine

## 2023-10-19 ENCOUNTER — Ambulatory Visit
Admission: RE | Admit: 2023-10-19 | Discharge: 2023-10-19 | Disposition: A | Discharge status: Home / Self Care | Payer: Medicare Other | Source: Ambulatory Visit | Attending: Internal Medicine | Admitting: Internal Medicine

## 2023-10-19 DIAGNOSIS — I422 Other hypertrophic cardiomyopathy: Secondary | ICD-10-CM

## 2023-10-19 MED ORDER — GADOBUTROL 1 MMOL/ML IV SOLN
14.0000 mL | Freq: Once | INTRAVENOUS | Status: AC | PRN
  Administered 2023-10-19: 14 mL via INTRAVENOUS

## 2024-02-14 ENCOUNTER — Other Ambulatory Visit: Payer: Self-pay | Admitting: Pulmonary Disease

## 2024-02-14 ENCOUNTER — Ambulatory Visit
Admission: RE | Admit: 2024-02-14 | Discharge: 2024-02-14 | Disposition: A | Discharge status: Home / Self Care | Source: Ambulatory Visit | Attending: Pulmonary Disease | Admitting: Pulmonary Disease

## 2024-02-14 DIAGNOSIS — K5900 Constipation, unspecified: Secondary | ICD-10-CM

## 2024-02-14 DIAGNOSIS — J449 Chronic obstructive pulmonary disease, unspecified: Secondary | ICD-10-CM | POA: Diagnosis present

## 2024-02-14 MED ORDER — IOHEXOL 300 MG/ML  SOLN
100.0000 mL | Freq: Once | INTRAMUSCULAR | Status: AC | PRN
  Administered 2024-02-14: 100 mL via INTRAVENOUS

## 2024-03-15 ENCOUNTER — Other Ambulatory Visit: Payer: Self-pay

## 2024-03-15 DIAGNOSIS — D329 Benign neoplasm of meninges, unspecified: Secondary | ICD-10-CM

## 2024-03-17 ENCOUNTER — Ambulatory Visit
Admission: RE | Admit: 2024-03-17 | Discharge: 2024-03-17 | Disposition: A | Discharge status: Home / Self Care | Source: Ambulatory Visit

## 2024-03-17 DIAGNOSIS — D329 Benign neoplasm of meninges, unspecified: Secondary | ICD-10-CM | POA: Insufficient documentation

## 2024-03-17 MED ORDER — GADOBUTROL 1 MMOL/ML IV SOLN
10.0000 mL | Freq: Once | INTRAVENOUS | Status: AC | PRN
  Administered 2024-03-17: 10 mL via INTRAVENOUS

## 2024-04-06 ENCOUNTER — Other Ambulatory Visit: Payer: Self-pay | Admitting: Pulmonary Disease

## 2024-04-06 DIAGNOSIS — Z87891 Personal history of nicotine dependence: Secondary | ICD-10-CM

## 2024-04-06 DIAGNOSIS — T733XXS Exhaustion due to excessive exertion, sequela: Secondary | ICD-10-CM

## 2024-04-06 DIAGNOSIS — R634 Abnormal weight loss: Secondary | ICD-10-CM

## 2024-04-06 DIAGNOSIS — C801 Malignant (primary) neoplasm, unspecified: Secondary | ICD-10-CM

## 2024-04-16 ENCOUNTER — Ambulatory Visit
Admission: RE | Admit: 2024-04-16 | Discharge: 2024-04-16 | Disposition: A | Discharge status: Home / Self Care | Source: Ambulatory Visit | Attending: Pulmonary Disease | Admitting: Pulmonary Disease

## 2024-04-16 DIAGNOSIS — R5383 Other fatigue: Secondary | ICD-10-CM | POA: Insufficient documentation

## 2024-04-16 DIAGNOSIS — T733XXS Exhaustion due to excessive exertion, sequela: Secondary | ICD-10-CM | POA: Insufficient documentation

## 2024-04-16 DIAGNOSIS — I2510 Atherosclerotic heart disease of native coronary artery without angina pectoris: Secondary | ICD-10-CM | POA: Diagnosis not present

## 2024-04-16 DIAGNOSIS — R634 Abnormal weight loss: Secondary | ICD-10-CM | POA: Diagnosis not present

## 2024-04-16 DIAGNOSIS — Z87891 Personal history of nicotine dependence: Secondary | ICD-10-CM | POA: Insufficient documentation

## 2024-04-16 DIAGNOSIS — C801 Malignant (primary) neoplasm, unspecified: Secondary | ICD-10-CM | POA: Insufficient documentation

## 2024-04-16 LAB — GLUCOSE, CAPILLARY: Glucose-Capillary: 86 mg/dL (ref 70–99)

## 2024-04-16 MED ORDER — FLUDEOXYGLUCOSE F - 18 (FDG) INJECTION: 12.1500 | Freq: Once | INTRAVENOUS | Status: DC | PRN

## 2024-04-17 ENCOUNTER — Ambulatory Visit

## 2024-04-17 ENCOUNTER — Other Ambulatory Visit: Payer: Self-pay | Admitting: Obstetrics and Gynecology

## 2024-04-17 DIAGNOSIS — Z1231 Encounter for screening mammogram for malignant neoplasm of breast: Secondary | ICD-10-CM

## 2024-05-15 ENCOUNTER — Other Ambulatory Visit: Payer: Self-pay | Admitting: Medical Genetics

## 2024-05-17 ENCOUNTER — Ambulatory Visit
Admission: RE | Admit: 2024-05-17 | Discharge: 2024-05-17 | Disposition: A | Discharge status: Home / Self Care | Source: Ambulatory Visit | Attending: Obstetrics and Gynecology | Admitting: Obstetrics and Gynecology

## 2024-05-17 ENCOUNTER — Other Ambulatory Visit
Admission: RE | Admit: 2024-05-17 | Discharge: 2024-05-17 | Disposition: A | Discharge status: Home / Self Care | Source: Ambulatory Visit | Attending: Medical Genetics | Admitting: Medical Genetics

## 2024-05-17 DIAGNOSIS — Z1231 Encounter for screening mammogram for malignant neoplasm of breast: Secondary | ICD-10-CM | POA: Diagnosis present

## 2024-05-31 LAB — GENECONNECT MOLECULAR SCREEN: Genetic Analysis Overall Interpretation: POSITIVE — AB

## 2024-06-02 ENCOUNTER — Telehealth: Payer: Self-pay | Admitting: Medical Genetics

## 2024-06-02 DIAGNOSIS — Z1501 Genetic susceptibility to malignant neoplasm of breast: Secondary | ICD-10-CM | POA: Insufficient documentation

## 2024-06-02 NOTE — Telephone Encounter (Signed)
 [facility] Positive Result Note 06/02/2024 8:08 PM  SECOND ATTEMPT: Confirmed I was speaking with [name] [phone] by using name and DOB. Informed participant the reason for this call is to provide results for the above study. Results revealed Hereditary Breast and Ovarian Syndrome. Genetic counseling was offered and participant requests a call back to schedule. All questions were answered, and participant was thanked for their time and support of the above study. Participant was encouraged to contact [facility] if they have any further questions or concerns.

## 2024-06-04 ENCOUNTER — Telehealth: Payer: Self-pay | Admitting: Medical Genetics

## 2024-06-04 NOTE — Telephone Encounter (Signed)
 [facility] 06/04/2024  2:42 PM  Confirmed I was speaking with [name] [phone] by using name and DOB. Genetic counseling was offered and participant is scheduled. All questions were answered, and participant was thanked for their time and support of the above study. Participant was encouraged to contact [facility] if they have any further questions or concerns.    Jordyn Pennstrom, BS   [facility] Clinical Research Specialist II Direct Dial: [phone]  Fax: [phone]
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
## 2024-06-21 ENCOUNTER — Encounter: Payer: Self-pay | Admitting: Genetic Counselor

## 2024-06-21 ENCOUNTER — Ambulatory Visit (INDEPENDENT_AMBULATORY_CARE_PROVIDER_SITE_OTHER): Admitting: Genetic Counselor

## 2024-06-21 DIAGNOSIS — Z1589 Genetic susceptibility to other disease: Secondary | ICD-10-CM

## 2024-06-21 DIAGNOSIS — Z15068 Genetic susceptibility to other malignant neoplasm of digestive system: Secondary | ICD-10-CM

## 2024-06-21 DIAGNOSIS — R9430 Abnormal result of cardiovascular function study, unspecified: Secondary | ICD-10-CM

## 2024-06-21 DIAGNOSIS — Z1509 Genetic susceptibility to other malignant neoplasm: Secondary | ICD-10-CM

## 2024-06-21 DIAGNOSIS — Z1501 Genetic susceptibility to malignant neoplasm of breast: Secondary | ICD-10-CM

## 2024-06-21 DIAGNOSIS — Z1507 Genetic susceptibility to malignant neoplasm of urinary tract: Secondary | ICD-10-CM

## 2024-06-21 DIAGNOSIS — Z1505 Genetic susceptibility to malignant neoplasm of fallopian tube(s): Secondary | ICD-10-CM

## 2024-06-21 DIAGNOSIS — Z1502 Genetic susceptibility to malignant neoplasm of ovary: Secondary | ICD-10-CM

## 2024-06-21 NOTE — Progress Notes (Unsigned)
 PRIMARY PROVIDER:  Lenon Layman ORN, MD  PRIMARY REASON FOR VISIT:  1. BRCA2 gene mutation positive in female     Ms. [name], a 48 y.o. female, was seen for a [facility] genetic counseling as follow up to her participation in the [place] Study, part of the [facility] Program. Ms. Perry presents to clinic today to discuss the results of the genetic testing provided by the GeneConnect study, which did identify a pathogenic variant in the BRCA2 gene.   RELEVANT MEDICAL HISTORY:  Ms. Wirsing reports a diagnosis of basal cell carcinoma on her nose in her 30s, resected. She also reports a meningioma diagnosed at age 56 and a thyroid  nodule. She had a hysterectomy/BSO in 2018 due to endometriosis. She entered surgical menopause at this time. She reports 130 lb intentional weight loss over the last year. Mammogram 05/2024, density B. Colonoscopy 01/2018, normal.   Ms. Kretz was first diagnosed with high cholesterol at the age of 52, following a myocardial infarction treated with stent and diagnosis of coronary artery disease. Her lipids are managed by atorvastatin. She does not recall pre-statin lipid levels.   Past Medical History:  Diagnosis Date   Anxiety    Arthritis    back, knees , neck and feet   Asthma    Basal cell carcinoma    Cancer (HCC)    skin - basal cell on nose   Collagen vascular disease    rheumatoid arthritis   COPD (chronic obstructive pulmonary disease) (HCC) 03/2017   mild   Coronary artery disease    Depression    Diabetes mellitus without complication (HCC)    Endometriosis    Fibromyalgia    Gastric ulcer    GERD (gastroesophageal reflux disease)    Headache    migraines   Hyperlipidemia    Hypertension    IBS (irritable bowel syndrome)    Lupus    Meningioma (HCC)    Myocardial infarction (HCC) 08/2009   Pneumonia    Sleep apnea    uses cpap   Tremors of nervous system    essential tremor is hands    Past Surgical History:   Procedure Laterality Date   ABDOMINAL HYSTERECTOMY     CARDIAC CATHETERIZATION  2010   stent x 1; 12/31/13 [facility] ([facility] in [place]): 30-40% dLAD, mid LAD stent patent, 30% mLCX, 40% mRCA. Medical therapy.   CARPAL TUNNEL RELEASE Left 06/11/2020   Procedure: CARPAL TUNNEL RELEASE;  Surgeon: Shari Easter, MD;  Location: [facility] OR;  Service: Orthopedics;  Laterality: Left;  with local anesthesia   cataract surgery Bilateral    CESAREAN SECTION  2006   CHOLECYSTECTOMY  2008   COLONOSCOPY WITH PROPOFOL  N/A 01/18/2018   Procedure: COLONOSCOPY WITH PROPOFOL ;  Surgeon: Toledo, Ladell POUR, MD;  Location: [facility] ENDOSCOPY;  Service: Gastroenterology;  Laterality: N/A;   DIAGNOSTIC LAPAROSCOPY  04/2000   DILATION AND CURETTAGE OF UTERUS  04/1997 missed ab   LAPAROSCOPIC OVARIAN CYSTECTOMY Right 2013   MULTIPLE TOOTH EXTRACTIONS  1998   4 wisdom teeth   UTERINE STENT PLACEMENT Bilateral 08/15/2017   Procedure: UTERINE STENT PLACEMENT;  Surgeon: Lovetta Debby PARAS, MD;  Location: [facility] ORS;  Service: Gynecology;  Laterality: Bilateral;    Social History   Socioeconomic History   Marital status: Married    Spouse name: Not on file   Number of children: Not on file   Years of education: Not on file   Highest education level: Not on  file  Occupational History   Not on file  Tobacco Use   Smoking status: Every Day    Current packs/day: 0.50    Average packs/day: 0.5 packs/day for 37.8 years (18.9 ttl pk-yrs)    Types: Cigarettes    Start date: 1988   Smokeless tobacco: Never  Vaping Use   Vaping status: Never Used  Substance and Sexual Activity   Alcohol use: No   Drug use: No   Sexual activity: Not on file  Other Topics Concern   Not on file  Social History Narrative   Not on file   Social Drivers of Health   Financial Resource Strain: High Risk (06/12/2024)   Received from [facility]   Overall Financial Resource Strain (CARDIA)    Difficulty of Paying  Living Expenses: Very hard  Food Insecurity: Food Insecurity Present (06/12/2024)   Received from [facility]   Hunger Vital Sign    Within the past 12 months, you worried that your food would run out before you got the money to buy more.: Sometimes true    Within the past 12 months, the food you bought just didn't last and you didn't have money to get more.: Sometimes true  Transportation Needs: No Transportation Needs (06/12/2024)   Received from [facility] - Transportation    In the past 12 months, has lack of transportation kept you from medical appointments or from getting medications?: No    Lack of Transportation (Non-Medical): No  Physical Activity: Not on file  Stress: Not on file  Social Connections: Not on file     FAMILY HISTORY:  We obtained a detailed, 3-generation family history.  Significant diagnoses are listed below: Family History  Problem Relation Age of Onset   Hypertension Mother    Cancer Mother        vulvar cancer   Non-Hodgkin's lymphoma Mother 72   Lung cancer Mother 79       metastatic small cell   Stroke Mother    Dementia Mother    Hypotension Father    Arthritis Father    Hyperlipidemia Father    CAD Father    Lung cancer Maternal Uncle    COPD Maternal Grandfather    Hyperlipidemia Maternal Grandmother    Heart attack Maternal Grandmother    Heart attack Paternal Grandmother    Autism spectrum disorder Daughter    Anxiety disorder Daughter    Premature ovarian failure Sister    Breast cancer Other 30 - 59   Prostate cancer Maternal Cousin    Kidney disease Maternal Cousin    BRCA 1/2 Neg Hx    Ms. Dace is unaware of previous family history of genetic testing. She reports her daughter, age 80, has a history of mild autism, anxiety and depression, tachycardia and idiopathic intracranial hypertension, planning for lumbar puncture. She has a sister, age 78, with a history of precancerous cells  in her uterus or cervix and a history of infertility due to premature ovarian insufficiency and fibroids. Her father, age 7, has a history of coronary artery disease and high cholesterol. Her paternal grandmother died at an unknown age of a heart attack. There is a paternal first cousin once removed diagnosed with breast cancer in her 71s. Her mother, age 44, was diagnosed with metastatic lung cancer at age 60, non-Hodgkin's lymphoma in her 84s that was treated with chemo and is currently being evaluated for breast cancer due to  an inverted nipple and mass seen on mammogram, with biopsy completed today. Ms. Melle maternal uncle was diagnosed with lung cancer and passed away in his 62s. Her maternal first cousin has been diagnosed with prostate and possibly kidney cancer. Her maternal grandfather died of COPD in his 10s and her maternal grandmother had a history of heart disease, high cholesterol and multiple heart attacks, and died in her 62s. She's doe not report a family history of birth defects, intellectual disability or developmental delay, or other genetic conditions.    GENETIC TEST RESULTS:  Ms. Bollig participated in the [place] [facility] population genomic screening program. A single, heterozygous pathogenic variant was detected in the BRCA2 gene called [id number]. There were no pathogenic or likely pathogenic variants detected in other genes reported on in this study.   [facility] Population Screen is a screening test that analyzes 11 genes related to hereditary breast and ovarian cancer syndrome (HBOC), Lynch syndrome, and familial hypercholesterolemia. These include APOB, BRCA1, BRCA2, EPCAM, LDLR, LDLRAP1, PCSK9, PMS2 (excluding exons 11-15), MLH1, MSH2, MSH6. This test reports pathogenic and likely pathogenic variants, but does not report on variants of unknown significance. The absence of any additional pathogenic variants is reassuring, but does not rule out a hereditary  condition. There are other variants and genes associated with heart disease, hereditary cancer and other inherited conditions that were not included in this test. Please see full test report in labs, labeled [facility] Molecular Screen, dated 05/17/2024.     CLINICAL INFORMATION:  Pathogenic variants or mutations in BRCA2 are associated with an increased risk to develop breast cancer, ovarian cancer, prostate cancer, and pancreatic cancer, and melanoma skin cancer. People with a hereditary mutation in BRCA2 have a condition called Hereditary Breast and Ovarian Cancer (HBOC) Syndrome. The cancer risks and management recommendations associated with BRCA2 mutations are listed below:  Type of Cancer BRCA2 Mutation Lifetime Risk Average Lifetime Risk  Female breast 55-69% 12%  Contralateral, female breast  25% over 20 years Less than 10% over 49 years  Female breast Up to 7.1% by age 48 0.1%  Ovarian 13-29% 1-2%  Prostate 19-61% 12%  Pancreatic 5-10% 1.6%  Melanoma Increased  1-2%   Limited data suggest there may be a slightly increased risk of serous uterine cancer in women with a BRCA mutation. Further research is needed to better understand this association.   Risk numbers are based on NCCN v1.2026 Risk estimates may evolve over time as new information is learned about BRCA2 mutations.    Management Recommendations: Per NCCN v1.2026, Genetic/Familial High Risk Assessment: Breast, Ovarian, Pancreatic, Prostate.   Breast Screening/Risk Reduction: For women (assigned female at birth):  Screening: Breast awareness starting at age 7 Clinical breast exam every 6-12 months starting at age 85, or sooner depending on family history  Annual breast MRI with contrast beginning at age 83-29 (or annual mammograms with consideration of tomosynthesis if MRI is unavailable), although the age to initiate screening may be individualized based on family history Annual mammogram beginning at age 20 until age  70 with consideration of tomosynthesis with continuation of annual breast MRI with contrast Management should be personalized to people over the age of 49 years old  Surgery:  Prophylactic bilateral risk-reducing mastectomy (RRM), which is the removed of breast tissue before cancer develops, can reduce the risk of a breast cancer by 90% or more depending on the type of surgery. This option can be discussed with a surgeon and a plastic surgeon if  breast reconstruction is completed as well.  For women with a BRCA2 pathogenic or likely pathogenic variant who are treated for breast cancer and have not had a bilateral mastectomy, screening with annual mammogram with consideration of tomosynthesis and breast MRI should continue as described above. Medication (chemoprevention): Consider breast cancer risk reducing medications   For men (assigned female at birth):  Breast self-exam training and education starting at age 73 years Annual clinical breast exam starting at age 55 years  Consider annual mammogram starting at age 75 or 10 years before the earliest known female breast cancer in the family (whichever comes first).   Gynecological Cancer Screening/Risk Reduction: Non-surgical risk reduction: Consultation with a gynecologic oncologist is recommended Consideration of certain types of birth control, thorough discussion of benefits and risks. Oral contraceptive use has been shown to reduce the risk of ovarian cancer by approximately 60% in BRCA2 mutation carriers if taken for at least 5 years. This risk reduction remains even after discontinuation of oral contraceptives.  Ovarian cancer screening is an option for women who chose not to have a RRSO or who, as of yet, have not completed their family. Current screening methods for ovarian cancer are neither sensitive nor specific, meaning that often early stage ovarian cancer cannot be diagnosed through this screening.  Screening can also be falsely positive  with no cancer present. For this reason, RRSO is recommended over screening. If ovarian cancer screening is recommended by your physician, it could include: CA-125 blood tests Transvaginal ultrasounds Clinical pelvic exams Surgical risk reduction:  Risk reducing salpingo-oophorectomy (RRSO, surgery to remove ovaries and fallopian tubes) is recommended beginning at age 57-45 or once childbearing is complete. Recommend that surgery is completed with a provider familiar in screening for cancers, such as a gynecologic oncologist. Removal of fallopian tubes with delayed removal of ovaries may be considered as well This surgery can reduce the risk of ovarian cancer by approximately 80% and can further reduce breast cancer risk in women who have not yet gone through menopause. There is still a small risk of developing an ovarian-like cancer in the lining of the abdomen, called the peritoneum. Some studies have found a link between BRCA mutations and serous uterine cancer. We do not have enough information to make a recommendation about whether the uterus should be removed along with the ovaries and fallopian tubes. Women should talk about whether to remove the uterus (hysterectomy) with the doctor who will do the surgery. Hormone replacement therapy could be considered based on the physician's discretion. Consider referral to fertility specialist to discuss age related fertility considerations Ms. Griffeth reports a hysterectomy and bilateral saplingo-oophorectomy in 2018   Prostate Cancer Screening: Annual digital rectal exam (DRE) at age 69 Annual PSA blood test at age 58  Pancreatic Cancer Screening/Risk Reduction: Avoid smoking, heavy alcohol use, and obesity. Recommended for BRCA2 pathogenic and likely pathogenic variants. Begin at age 51 (or if there is a close family member with pancreatic cancer, 10 years before the age at which they were diagnosed) and repeat yearly.  Ideally, screening should  be performed in experienced centers utilizing a multidisciplinary approach under research conditions. Recommended screening could include annual endoscopic ultrasound (preferred) and/or MRI of the pancreas. CA19-9 testing in blood may be considered based on the physician's discretion.  Skin Cancer Screening and Risk Reduction: Regular skin self-examinations Individuals should notify their physicians of any changes to moles such as increasing in size, darkening in color, or other change in appearance. Annual skin  examinations by a dermatologist  Follow sun-safety recommendations such as: Using UVA and UVB 30 SPF or higher sunscreen Avoiding sunburns Limiting sun exposure, especially during the hours of 11am-4pm  Wearing protective clothing and sunglasses Avoid using tanning beds  Additional Considerations: Recent studies have suggested PARP inhibitors may be a beneficial chemotherapeutic agent for a subset of patients with BRCA2-associated breast, ovarian, prostate, and pancreatic cancers. Clinical trials are currently in process to determine if and how these agents can be useful in the treatment of BRCA2 cancer patients.  This information is based on current understanding of the gene and may change in the future.    IMPLICATIONS FOR FAMILY MEMBERS: Individuals with BRCA2 are encouraged to share information about their genetic test results with family members. A family letter will be provided to help share this result with relatives. "Cascade screening" is a systematic process through which additional family members with a BRCA2 mutation are identified, starting with all first degree relatives over the age of 76 (parents, siblings, and children) of people who have a BRCA2 mutation.   Hereditary predisposition to cancer due to pathogenic variants in the BRCA2 gene has autosomal dominant inheritance. This means that an individual with a pathogenic variant has a 50% chance of passing the condition  on to their children. Identification of a pathogenic variant allows for the recognition of at-risk relatives who can pursue testing for the familial variant.  Family members are encouraged to consider genetic testing for this familial pathogenic variant. As there are generally no childhood cancer risks associated with pathogenic variants in the BRCA2 gene, individuals in the family are not typically recommended to have testing until they reach at least 49 years of age. They may contact our office at [phone] for more information or to schedule an appointment. Family members who live outside of the area are encouraged to find a genetic counselor in their area by visiting: [email or web address].  Genetic testing for the family variant is offered by [facility] at no charge for family members if completed within 90 days of Ms. Ariola's report release date.   Additionally, for any family members who are of childbearing age, identification of a BRCA2 mutation may have reproductive implications. In rare cases a child may inherit a BRCA2 mutation from each parent and have a condition called Fanconi Anemia (FA). FA is a childhood onset condition that can be associated with developmental differences, childhood cancer and bone marrow failure, among other health concerns. Therefore, if anyone of childbearing age is found to have the familial BRCA2 mutation, testing of their reproductive partner should be considered to clarify the chance to have a child with FA.    For individuals with an identified gene mutation, reproductive options are available including testing on-going pregnancies, testing children when they are of age, or IVF with the option to test embryos for mutations already found in the family. These options may be explored in greater detail with a reproductive or prenatal genetic counselor for more details.  RESOURCES: Facing Our Risk of Cancer Empowered (FORCE): www.facingourrisk.org    Bright Pink: www.brightpink.org Nothing Pink: www.nothingpink.org  [facility] Inherited Cancer Registry: https://inheritedcancer.net/  ADDITIONAL TESTING: Recommend additional genetic testing due to her personal history of dyslipidemia and coronary artery disease diagnosed in her 30s. Patient declined at this time, but will notify us  if interested in further testing.   PLAN:  Referral to the [facility] High Risk Breast Program is recommended to further discuss options for breast cancer screening and prevention.  Patient declined and plans to discuss with her PCP and OBGYN.  Referral to [facility] is recommended to further discuss options for pancreatic cancer screening. Patient declined and will discuss with her PCP.   Results will be shared with Ms. Anstead's primary care provider, Lenon Layman ORN, MD, and OBGYN, Heather Penton, MD, for further management.   Genetic test results should be shared with relatives, who can consider undergoing genetic testing for the BRCA2 pathogenic variant.   Lastly, we encouraged Ms. Lundstrom to remain in contact with [facility]'s genetics team so that we can continuously update the family history and inform her of any changes in our understanding of hereditary breast and ovarian cancer and any additional genetic testing that may be of benefit for this family.   Ms. Kirshner questions were answered to her satisfaction today. Our contact information was provided should additional questions or concerns arise. Thank you for the referral and allowing us  to share in the care of your patient.   Burnard Ogren, MS, [facility] Licensed, Retail [email or web address] phone: [phone]  50 minutes were spent on the date of the encounter in service to the patient including preparation, face-to-face consultation, documentation and care coordination. The patient was seen alone.     I connected with  Ms. Hoog on  06/21/2024 at 2 pm EDT by telephone and verified that I am speaking with the correct person using two identifiers.   Patient location: home, lives between [place]  and [place]   Provider location: [facility]  __________________________________________________________________ For Office Staff:  Number of people involved in session: 1 Was an Intern/ student involved with case: no

## 2024-06-22 ENCOUNTER — Encounter: Payer: Self-pay | Admitting: Genetic Counselor

## 2024-06-27 ENCOUNTER — Ambulatory Visit: Admission: RE | Admit: 2024-06-27 | Discharge: 2024-06-27 | Disposition: A | Discharge status: Home / Self Care

## 2024-06-27 ENCOUNTER — Other Ambulatory Visit: Payer: Self-pay

## 2024-06-27 ENCOUNTER — Encounter: Payer: Self-pay | Admitting: Internal Medicine

## 2024-06-27 ENCOUNTER — Encounter: Admission: RE | Disposition: A | Discharge status: Home / Self Care | Payer: Self-pay | Source: Home / Self Care

## 2024-06-27 DIAGNOSIS — Z6836 Body mass index (BMI) 36.0-36.9, adult: Secondary | ICD-10-CM | POA: Insufficient documentation

## 2024-06-27 DIAGNOSIS — Z955 Presence of coronary angioplasty implant and graft: Secondary | ICD-10-CM | POA: Insufficient documentation

## 2024-06-27 DIAGNOSIS — I2510 Atherosclerotic heart disease of native coronary artery without angina pectoris: Secondary | ICD-10-CM | POA: Insufficient documentation

## 2024-06-27 DIAGNOSIS — E119 Type 2 diabetes mellitus without complications: Secondary | ICD-10-CM | POA: Diagnosis not present

## 2024-06-27 DIAGNOSIS — R0602 Shortness of breath: Secondary | ICD-10-CM | POA: Insufficient documentation

## 2024-06-27 DIAGNOSIS — M069 Rheumatoid arthritis, unspecified: Secondary | ICD-10-CM | POA: Diagnosis not present

## 2024-06-27 DIAGNOSIS — J449 Chronic obstructive pulmonary disease, unspecified: Secondary | ICD-10-CM | POA: Diagnosis not present

## 2024-06-27 DIAGNOSIS — F17210 Nicotine dependence, cigarettes, uncomplicated: Secondary | ICD-10-CM | POA: Diagnosis not present

## 2024-06-27 DIAGNOSIS — I10 Essential (primary) hypertension: Secondary | ICD-10-CM | POA: Insufficient documentation

## 2024-06-27 DIAGNOSIS — R9430 Abnormal result of cardiovascular function study, unspecified: Secondary | ICD-10-CM | POA: Insufficient documentation

## 2024-06-27 DIAGNOSIS — E66812 Obesity, class 2: Secondary | ICD-10-CM | POA: Insufficient documentation

## 2024-06-27 DIAGNOSIS — I428 Other cardiomyopathies: Secondary | ICD-10-CM | POA: Diagnosis not present

## 2024-06-27 DIAGNOSIS — G4733 Obstructive sleep apnea (adult) (pediatric): Secondary | ICD-10-CM | POA: Diagnosis not present

## 2024-06-27 HISTORY — PX: LEFT HEART CATH AND CORONARY ANGIOGRAPHY: CATH118249

## 2024-06-27 LAB — GLUCOSE, CAPILLARY
Glucose-Capillary: 110 mg/dL — ABNORMAL HIGH (ref 70–99)
Glucose-Capillary: 129 mg/dL — ABNORMAL HIGH (ref 70–99)

## 2024-06-27 SURGERY — LEFT HEART CATH AND CORONARY ANGIOGRAPHY
Anesthesia: Moderate Sedation | Laterality: Left

## 2024-06-27 MED ORDER — VERAPAMIL HCL 2.5 MG/ML IV SOLN
INTRAVENOUS | Status: AC
  Filled 2024-06-27: qty 2

## 2024-06-27 MED ORDER — HEPARIN (PORCINE) IN NACL 1000-0.9 UT/500ML-% IV SOLN
INTRAVENOUS | Status: AC
  Filled 2024-06-27: qty 1000

## 2024-06-27 MED ORDER — ONDANSETRON HCL 4 MG/2ML IJ SOLN: 4.0000 mg | Freq: Four times a day (QID) | INTRAMUSCULAR | Status: DC | PRN

## 2024-06-27 MED ORDER — ASPIRIN 81 MG PO CHEW
81.0000 mg | CHEWABLE_TABLET | ORAL | Status: AC
  Administered 2024-06-27: 81 mg via ORAL

## 2024-06-27 MED ORDER — SODIUM CHLORIDE 0.9% FLUSH: 3.0000 mL | Freq: Two times a day (BID) | INTRAVENOUS | Status: DC

## 2024-06-27 MED ORDER — SODIUM CHLORIDE 0.9 % IV SOLN: 250.0000 mL | INTRAVENOUS | Status: DC | PRN

## 2024-06-27 MED ORDER — MIDAZOLAM HCL 2 MG/2ML IJ SOLN
INTRAMUSCULAR | Status: AC
  Filled 2024-06-27: qty 2

## 2024-06-27 MED ORDER — FREE WATER: 500.0000 mL | Freq: Once | Status: DC

## 2024-06-27 MED ORDER — HYDRALAZINE HCL 20 MG/ML IJ SOLN: 10.0000 mg | INTRAMUSCULAR | Status: DC | PRN

## 2024-06-27 MED ORDER — ACETAMINOPHEN 325 MG PO TABS: 650.0000 mg | ORAL_TABLET | ORAL | Status: DC | PRN

## 2024-06-27 MED ORDER — HEPARIN SODIUM (PORCINE) 1000 UNIT/ML IJ SOLN
INTRAMUSCULAR | Status: DC | PRN
  Administered 2024-06-27: 5000 [IU] via INTRAVENOUS

## 2024-06-27 MED ORDER — SODIUM CHLORIDE 0.9 % IV SOLN
250.0000 mL | INTRAVENOUS | Status: DC | PRN
  Administered 2024-06-27: 250 mL via INTRAVENOUS

## 2024-06-27 MED ORDER — VERAPAMIL HCL 2.5 MG/ML IV SOLN
INTRAVENOUS | Status: DC | PRN
  Administered 2024-06-27: 2.5 mg via INTRAVENOUS

## 2024-06-27 MED ORDER — HEPARIN SODIUM (PORCINE) 1000 UNIT/ML IJ SOLN
INTRAMUSCULAR | Status: AC
  Filled 2024-06-27: qty 10

## 2024-06-27 MED ORDER — FENTANYL CITRATE (PF) 100 MCG/2ML IJ SOLN
INTRAMUSCULAR | Status: DC | PRN
  Administered 2024-06-27: 50 ug via INTRAVENOUS

## 2024-06-27 MED ORDER — IOHEXOL 300 MG/ML  SOLN
INTRAMUSCULAR | Status: DC | PRN
Start: 2024-06-27 — End: 2024-06-27
  Administered 2024-06-27: 34 mL

## 2024-06-27 MED ORDER — ASPIRIN 81 MG PO CHEW
CHEWABLE_TABLET | ORAL | Status: AC
  Filled 2024-06-27: qty 1

## 2024-06-27 MED ORDER — HEPARIN (PORCINE) IN NACL 1000-0.9 UT/500ML-% IV SOLN
INTRAVENOUS | Status: DC | PRN
  Administered 2024-06-27: 1000 mL

## 2024-06-27 MED ORDER — MIDAZOLAM HCL 2 MG/2ML IJ SOLN
INTRAMUSCULAR | Status: DC | PRN
  Administered 2024-06-27: 2 mg via INTRAVENOUS

## 2024-06-27 MED ORDER — FENTANYL CITRATE (PF) 100 MCG/2ML IJ SOLN
INTRAMUSCULAR | Status: AC
  Filled 2024-06-27: qty 2

## 2024-06-27 MED ORDER — LIDOCAINE HCL 1 % IJ SOLN
INTRAMUSCULAR | Status: AC
  Filled 2024-06-27: qty 20

## 2024-06-27 MED ORDER — SODIUM CHLORIDE 0.9% FLUSH: 3.0000 mL | INTRAVENOUS | Status: DC | PRN

## 2024-06-27 MED ORDER — LIDOCAINE HCL (PF) 1 % IJ SOLN
INTRAMUSCULAR | Status: DC | PRN
  Administered 2024-06-27: 2 mL

## 2024-06-27 SURGICAL SUPPLY — 9 items
CATH INFINITI 5 FR JL3.5 (CATHETERS) IMPLANT
CATH INFINITI JR4 5F (CATHETERS) IMPLANT
DEVICE RAD TR BAND REGULAR (VASCULAR PRODUCTS) IMPLANT
DRAPE BRACHIAL (DRAPES) IMPLANT
GLIDESHEATH SLEND A-KIT 6F 22G (SHEATH) IMPLANT
GUIDEWIRE INQWIRE 1.5J.035X260 (WIRE) IMPLANT
PACK CARDIAC CATH (CUSTOM PROCEDURE TRAY) ×1 IMPLANT
SET ATX-X65L (MISCELLANEOUS) IMPLANT
STATION PROTECTION PRESSURIZED (MISCELLANEOUS) IMPLANT

## 2024-06-27 NOTE — Discharge Instructions (Signed)
 Radial Site Care Refer to this sheet in the next few weeks. These instructions provide you with information about caring for yourself after your procedure. Your health care provider may also give you more specific instructions. Your treatment has been planned according to current medical practices, but problems sometimes occur. Call your health care provider if you have any problems or questions after your procedure. What can I expect after the procedure? After your procedure, it is typical to have the following: Bruising at the radial site that usually fades within 1-2 weeks. Blood collecting in the tissue (hematoma) that may be painful to the touch. It should usually decrease in size and tenderness within 1-2 weeks.  Follow these instructions at home: Take medicines only as directed by your health care provider. If you are on a medication called Metformin please do not take for 48 hours after your procedure. Over the next 48hrs please increase your fluid intake of water and non caffeine beverages to flush the contrast dye out of your system.  You may shower 24 hours after the procedure  Leave your bandage on and gently wash the site with plain soap and water. Pat the area dry with a clean towel. Do not rub the site, because this may cause bleeding.  Remove your dressing 48hrs after your procedure and leave open to air.  Do not submerge your site in water for 7 days. This includes swimming and washing dishes.  Check your insertion site every day for redness, swelling, or drainage. Do not apply powder or lotion to the site. Do not flex or bend the affected arm for 24 hours or as directed by your health care provider. Do not push or pull heavy objects with the affected arm for 24 hours or as directed by your health care provider. Do not lift over 10 lb (4.5 kg) for 5 days after your procedure or as directed by your health care provider. Ask your health care provider when it is okay to: Return to  work or school. Resume usual physical activities or sports. Resume sexual activity. Do not drive home if you are discharged the same day as the procedure. Have someone else drive you. You may drive 48 hours after the procedure Do not operate machinery or power tools for 24 hours after the procedure. If your procedure was done as an outpatient procedure, which means that you went home the same day as your procedure, a responsible adult should be with you for the first 24 hours after you arrive home. Keep all follow-up visits as directed by your health care provider. This is important. Contact a health care provider if: You have a fever. You have chills. You have increased bleeding from the radial site. Hold pressure on the site. Get help right away if: You have unusual pain at the radial site. You have redness, warmth, or swelling at the radial site. You have drainage (other than a small amount of blood on the dressing) from the radial site. The radial site is bleeding, and the bleeding does not stop after 15 minutes of holding steady pressure on the site. Your arm or hand becomes pale, cool, tingly, or numb. This information is not intended to replace advice given to you by your health care provider. Make sure you discuss any questions you have with your health care provider. Document Released: 10/02/2010 Document Revised: 02/05/2016 Document Reviewed: 03/18/2014 Elsevier Interactive Patient Education  2018 [employer].

## 2024-08-15 ENCOUNTER — Other Ambulatory Visit: Payer: Self-pay | Admitting: Physical Medicine & Rehabilitation

## 2024-08-15 DIAGNOSIS — M546 Pain in thoracic spine: Secondary | ICD-10-CM

## 2024-09-17 ENCOUNTER — Inpatient Hospital Stay
Admission: RE | Admit: 2024-09-17 | Discharge: 2024-09-17 | Disposition: A | Source: Ambulatory Visit | Attending: Physical Medicine & Rehabilitation | Admitting: Physical Medicine & Rehabilitation

## 2024-09-17 DIAGNOSIS — M546 Pain in thoracic spine: Secondary | ICD-10-CM
